# Patient Record
Sex: Female | Born: 1975 | Race: White | Hispanic: No | Marital: Married | Smoking: Never smoker
Health system: Southern US, Community
[De-identification: ages and names within clinical notes are randomized; demographics above are authoritative.]

## PROBLEM LIST (undated history)

## (undated) DIAGNOSIS — R011 Cardiac murmur, unspecified: Secondary | ICD-10-CM

## (undated) DIAGNOSIS — I10 Essential (primary) hypertension: Secondary | ICD-10-CM

## (undated) DIAGNOSIS — M1990 Unspecified osteoarthritis, unspecified site: Secondary | ICD-10-CM

## (undated) DIAGNOSIS — Z87442 Personal history of urinary calculi: Secondary | ICD-10-CM

## (undated) DIAGNOSIS — N289 Disorder of kidney and ureter, unspecified: Secondary | ICD-10-CM

## (undated) HISTORY — PX: CYST REMOVAL HAND: SHX6279

---
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
## 2001-01-04 ENCOUNTER — Inpatient Hospital Stay (HOSPITAL_COMMUNITY): Admission: AD | Admit: 2001-01-04 | Discharge: 2001-01-04 | Payer: Self-pay | Admitting: Obstetrics and Gynecology

## 2001-01-24 ENCOUNTER — Inpatient Hospital Stay (HOSPITAL_COMMUNITY): Admission: AD | Admit: 2001-01-24 | Discharge: 2001-01-27 | Payer: Self-pay | Admitting: Obstetrics and Gynecology

## 2001-01-28 ENCOUNTER — Encounter: Admission: RE | Admit: 2001-01-28 | Discharge: 2001-03-02 | Payer: Self-pay | Admitting: Obstetrics and Gynecology

## 2001-02-24 ENCOUNTER — Other Ambulatory Visit: Admission: RE | Admit: 2001-02-24 | Discharge: 2001-02-24 | Payer: Self-pay | Admitting: Obstetrics and Gynecology

## 2002-03-08 ENCOUNTER — Other Ambulatory Visit: Admission: RE | Admit: 2002-03-08 | Discharge: 2002-03-08 | Payer: Self-pay | Admitting: Obstetrics and Gynecology

## 2003-04-28 ENCOUNTER — Other Ambulatory Visit: Admission: RE | Admit: 2003-04-28 | Discharge: 2003-04-28 | Payer: Self-pay | Admitting: Obstetrics and Gynecology

## 2004-04-16 ENCOUNTER — Other Ambulatory Visit: Admission: RE | Admit: 2004-04-16 | Discharge: 2004-04-16 | Payer: Self-pay | Admitting: Obstetrics and Gynecology

## 2004-04-26 ENCOUNTER — Ambulatory Visit (HOSPITAL_BASED_OUTPATIENT_CLINIC_OR_DEPARTMENT_OTHER): Admission: RE | Admit: 2004-04-26 | Discharge: 2004-04-26 | Payer: Self-pay | Admitting: *Deleted

## 2004-12-03 ENCOUNTER — Other Ambulatory Visit: Admission: RE | Admit: 2004-12-03 | Discharge: 2004-12-03 | Payer: Self-pay | Admitting: Obstetrics and Gynecology

## 2004-12-29 HISTORY — PX: TUBAL LIGATION: SHX77

## 2005-03-15 ENCOUNTER — Inpatient Hospital Stay (HOSPITAL_COMMUNITY): Admission: AD | Admit: 2005-03-15 | Discharge: 2005-03-17 | Payer: Self-pay | Admitting: Obstetrics and Gynecology

## 2005-03-16 IMAGING — US US RETROPERITONEAL COMPLETE
1 series · 14 of 25 positions shown · non-contrast
Comparison: none

CLINICAL DATA: 24 weeks gestation.  Left flank pain.  
 BILATERAL RENAL ULTRASOUND:
 The right kidney measures 11.8 cm in length and the left kidney 12.8 cm in length.  There is mild fullness of the left renal pelvis and mild dilatation of the proximal left ureter.  However the ureter cannot be followed distally into the pelvis.  There is no dilatation of the right pelvocaliceal system or ureter.  There are ureteral jets noted within the bladder bilaterally and the bladder has a normal appearance.

[Series 1: us retroperitoneal complete · 0.33mm/px · 14 of 32 slices shown]
[im 1/32]
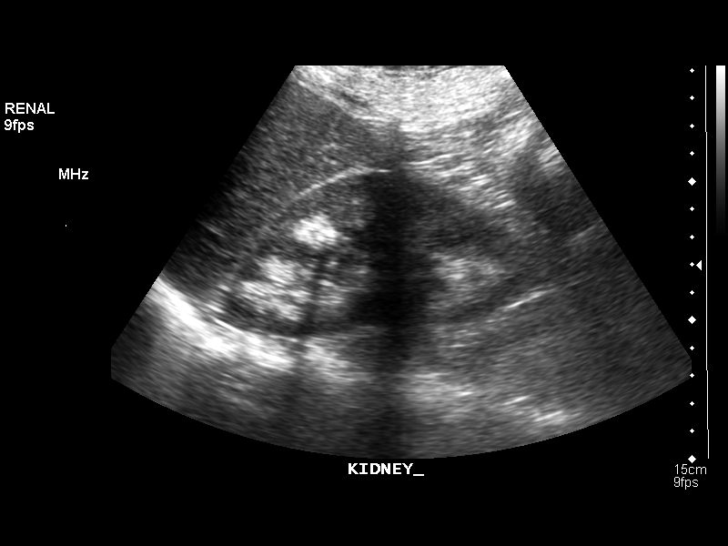
[im 3/32]
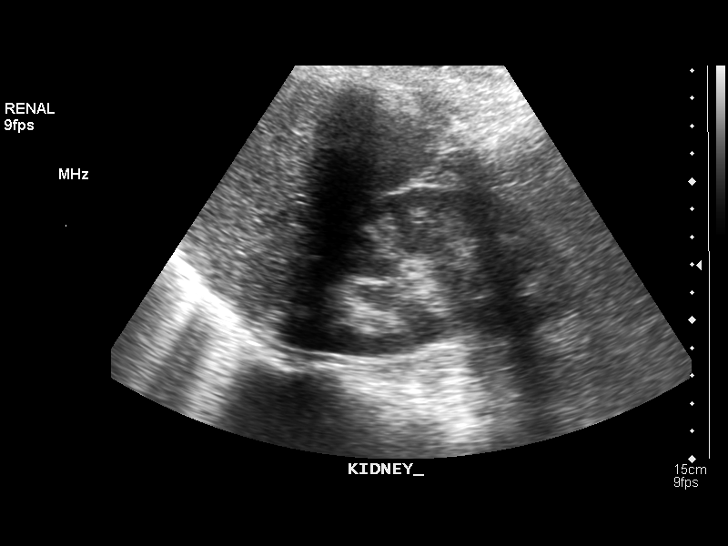
[im 6/32]
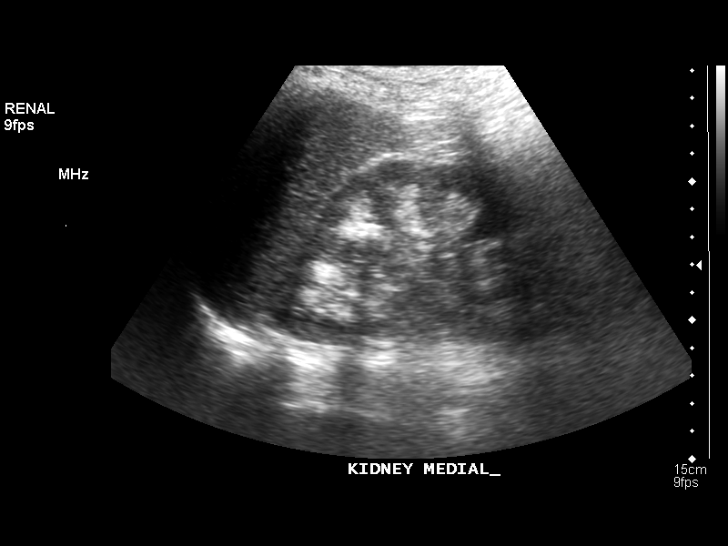
[im 8/32]
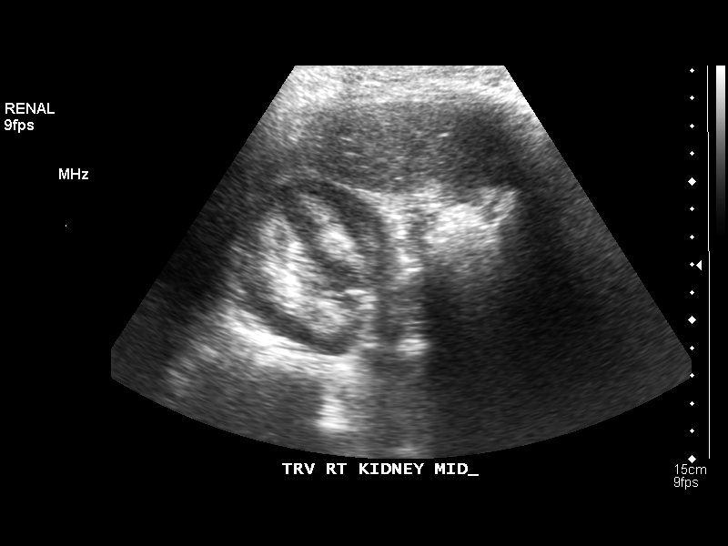
[im 11/32]
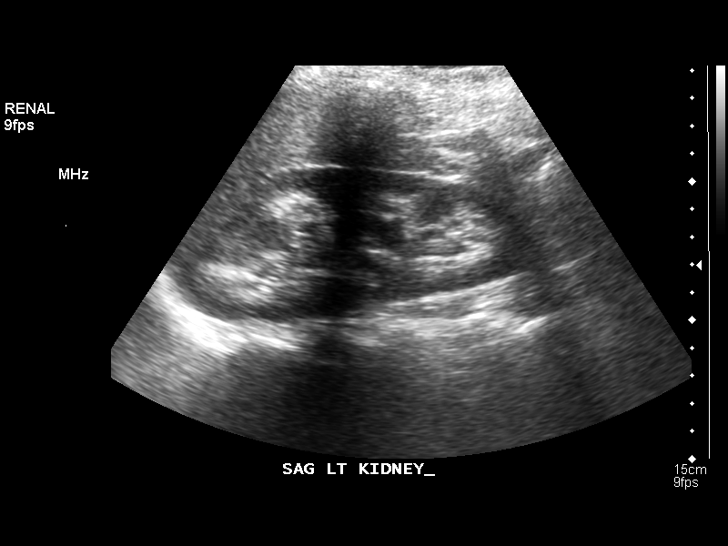
[im 12/32]
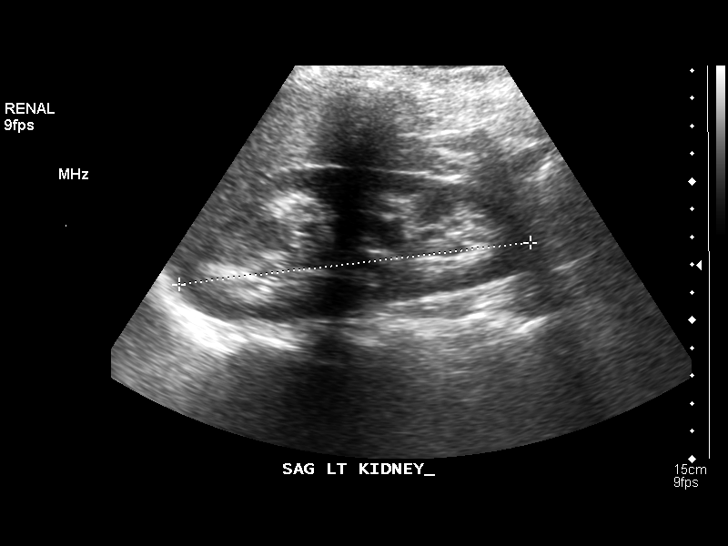
[im 15/32]
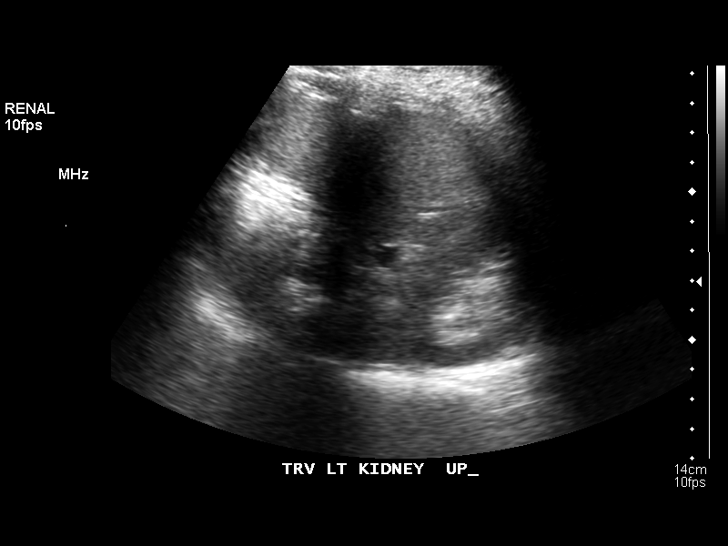
[im 17/32]
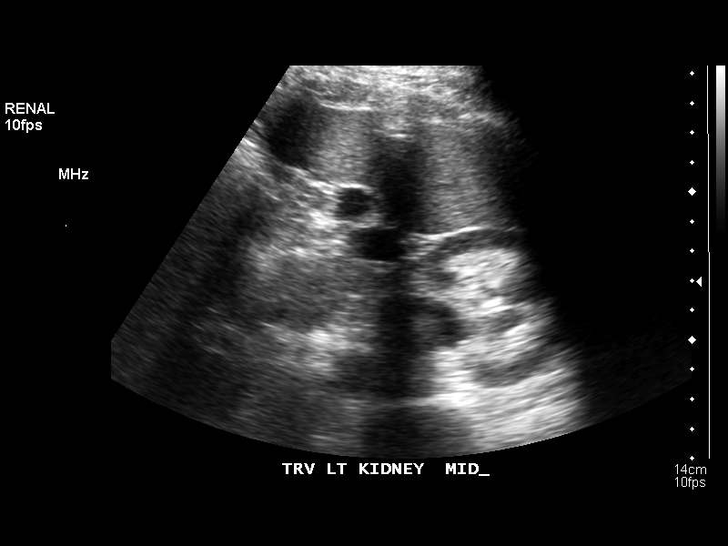
[im 20/32]
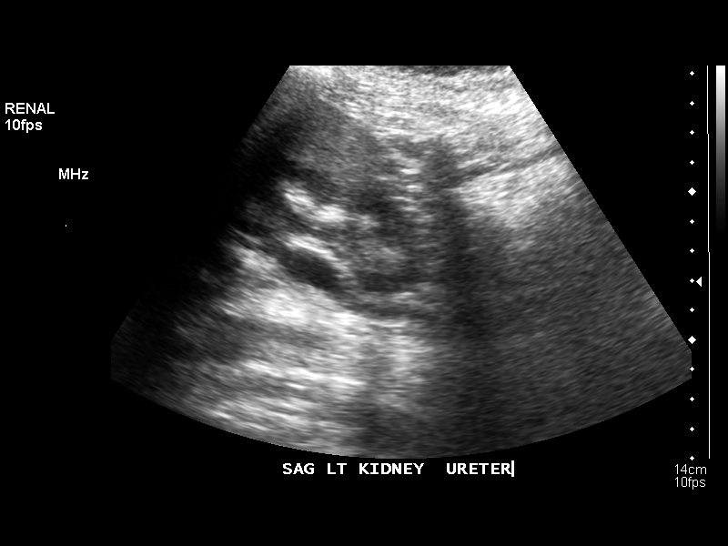
[im 21/32]
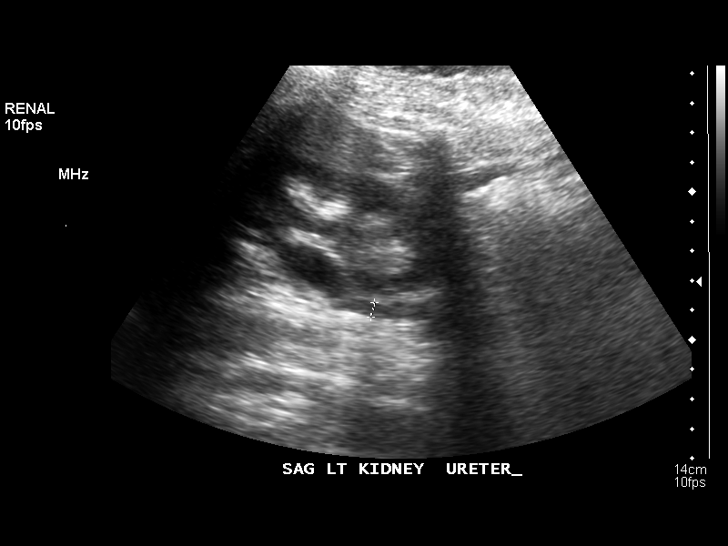
[im 24/32]
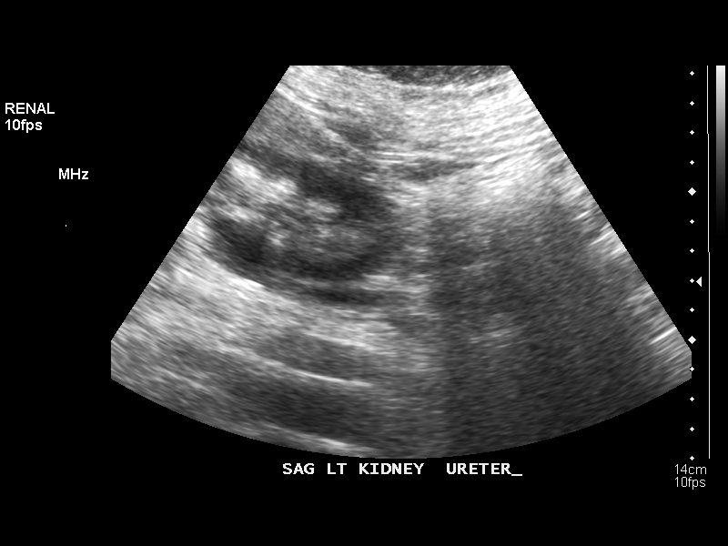
[im 26/32]
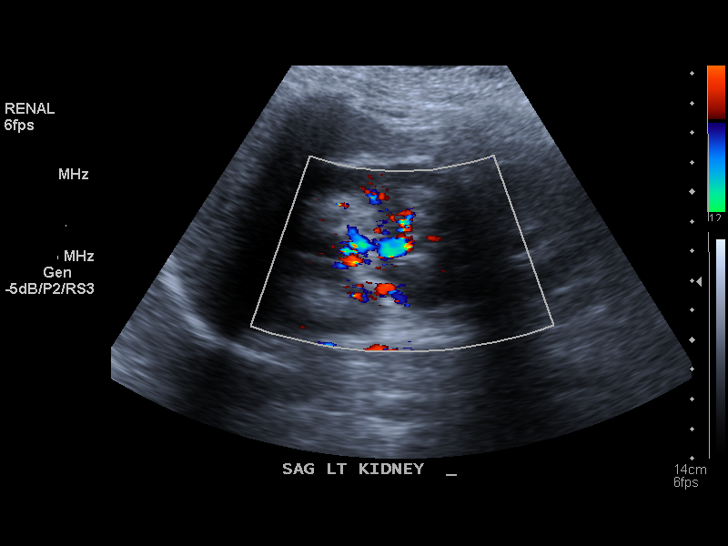
[im 29/32]
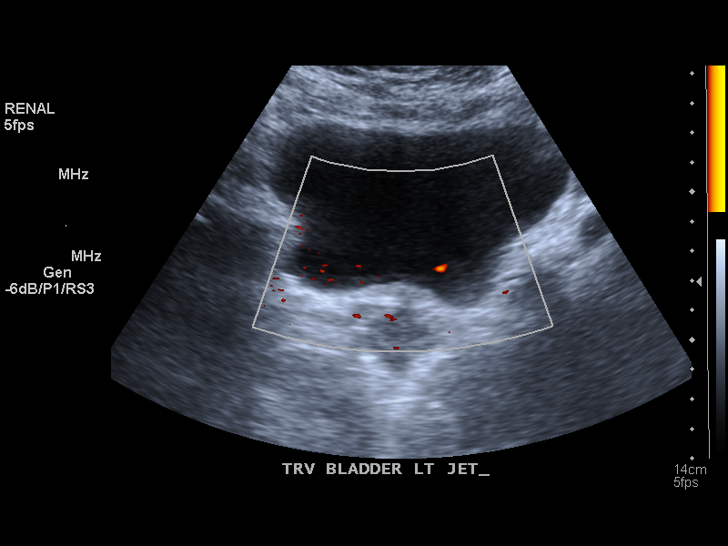
[im 32/32]
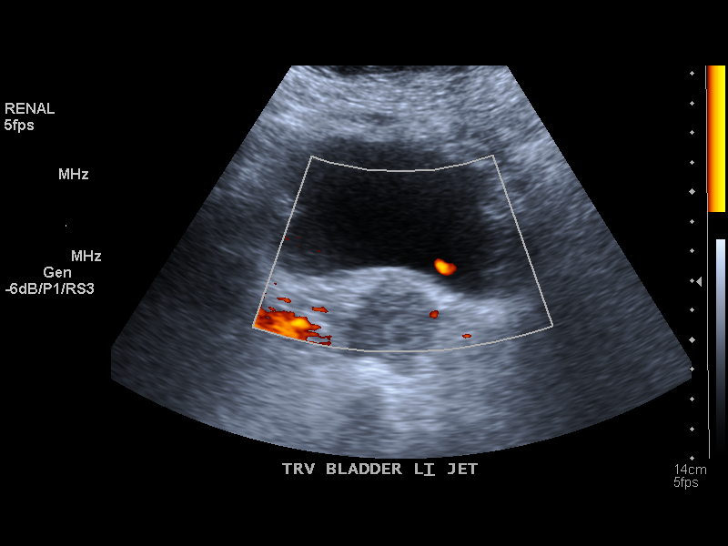

[14 of 25 positions shown; findings below may reference images not displayed]

IMPRESSION: Mild fullness/dilatation of the left renal pelvocaliceal system and proximal left ureter.  There are ureteral jets noted however, bilaterally.  Normal sized kidneys.

## 2005-06-09 ENCOUNTER — Inpatient Hospital Stay (HOSPITAL_COMMUNITY): Admission: AD | Admit: 2005-06-09 | Discharge: 2005-06-12 | Payer: Self-pay | Admitting: Obstetrics and Gynecology

## 2005-07-28 ENCOUNTER — Other Ambulatory Visit: Admission: RE | Admit: 2005-07-28 | Discharge: 2005-07-28 | Payer: Self-pay | Admitting: Obstetrics and Gynecology

## 2011-12-04 ENCOUNTER — Other Ambulatory Visit: Payer: Self-pay | Admitting: Urology

## 2011-12-05 ENCOUNTER — Other Ambulatory Visit: Payer: Self-pay | Admitting: Urology

## 2012-01-08 ENCOUNTER — Ambulatory Visit (HOSPITAL_COMMUNITY): Admission: RE | Admit: 2012-01-08 | Payer: BC Managed Care – PPO | Source: Ambulatory Visit | Admitting: Urology

## 2012-01-08 ENCOUNTER — Encounter (HOSPITAL_COMMUNITY): Admission: RE | Payer: Self-pay | Source: Ambulatory Visit

## 2012-01-08 SURGERY — LITHOTRIPSY, ESWL
Anesthesia: LOCAL | Laterality: Left

## 2015-04-03 ENCOUNTER — Other Ambulatory Visit: Payer: Self-pay | Admitting: Obstetrics and Gynecology

## 2015-04-04 LAB — CYTOLOGY - PAP

## 2017-02-25 ENCOUNTER — Emergency Department (HOSPITAL_COMMUNITY)
Admission: EM | Admit: 2017-02-25 | Discharge: 2017-02-25 | Disposition: A | Discharge status: Home / Self Care | Payer: BLUE CROSS/BLUE SHIELD | Attending: Emergency Medicine | Admitting: Emergency Medicine

## 2017-02-25 ENCOUNTER — Encounter (HOSPITAL_COMMUNITY): Payer: Self-pay

## 2017-02-25 DIAGNOSIS — N201 Calculus of ureter: Secondary | ICD-10-CM | POA: Insufficient documentation

## 2017-02-25 DIAGNOSIS — I10 Essential (primary) hypertension: Secondary | ICD-10-CM | POA: Insufficient documentation

## 2017-02-25 DIAGNOSIS — R109 Unspecified abdominal pain: Secondary | ICD-10-CM | POA: Diagnosis present

## 2017-02-25 DIAGNOSIS — N23 Unspecified renal colic: Secondary | ICD-10-CM

## 2017-02-25 HISTORY — DX: Disorder of kidney and ureter, unspecified: N28.9

## 2017-02-25 HISTORY — DX: Essential (primary) hypertension: I10

## 2017-02-25 LAB — COMPREHENSIVE METABOLIC PANEL
ALT: 20 U/L (ref 14–54)
AST: 20 U/L (ref 15–41)
Albumin: 4.2 g/dL (ref 3.5–5.0)
Alkaline Phosphatase: 58 U/L (ref 38–126)
Anion gap: 9 (ref 5–15)
BUN: 13 mg/dL (ref 6–20)
CO2: 23 mmol/L (ref 22–32)
Calcium: 9.1 mg/dL (ref 8.9–10.3)
Chloride: 102 mmol/L (ref 101–111)
Creatinine, Ser: 0.71 mg/dL (ref 0.44–1.00)
GFR calc Af Amer: >60 mL/min (ref >60)
GFR calc non Af Amer: >60 mL/min (ref >60)
Glucose, Bld: 104 mg/dL — ABNORMAL HIGH (ref 65–99)
Potassium: 3.7 mmol/L (ref 3.5–5.1)
Sodium: 134 mmol/L — ABNORMAL LOW (ref 135–145)
Total Bilirubin: 0.6 mg/dL (ref 0.3–1.2)
Total Protein: 7.2 g/dL (ref 6.5–8.1)

## 2017-02-25 LAB — URINALYSIS, ROUTINE W REFLEX MICROSCOPIC
Bacteria, UA: NONE SEEN
Bilirubin Urine: NEGATIVE
Glucose, UA: NEGATIVE mg/dL
Ketones, ur: NEGATIVE mg/dL
Leukocytes, UA: NEGATIVE
Nitrite: NEGATIVE
Protein, ur: NEGATIVE mg/dL
Specific Gravity, Urine: 1.003 — ABNORMAL LOW (ref 1.005–1.030)
pH: 7 (ref 5.0–8.0)

## 2017-02-25 LAB — CBC WITH DIFFERENTIAL/PLATELET
Basophils Absolute: 0 10*3/uL (ref 0.0–0.1)
Basophils Relative: 0 %
Eosinophils Absolute: 0.1 10*3/uL (ref 0.0–0.7)
Eosinophils Relative: 1 %
HCT: 39.5 % (ref 36.0–46.0)
Hemoglobin: 13.7 g/dL (ref 12.0–15.0)
Lymphocytes Relative: 21 %
Lymphs Abs: 1.9 10*3/uL (ref 0.7–4.0)
MCH: 30.4 pg (ref 26.0–34.0)
MCHC: 34.7 g/dL (ref 30.0–36.0)
MCV: 87.8 fL (ref 78.0–100.0)
Monocytes Absolute: 0.7 10*3/uL (ref 0.1–1.0)
Monocytes Relative: 7 %
Neutro Abs: 6.4 10*3/uL (ref 1.7–7.7)
Neutrophils Relative %: 71 %
Platelets: 232 10*3/uL (ref 150–400)
RBC: 4.5 MIL/uL (ref 3.87–5.11)
RDW: 12.2 % (ref 11.5–15.5)
WBC: 9 10*3/uL (ref 4.0–10.5)

## 2017-02-25 LAB — I-STAT BETA HCG BLOOD, ED (MC, WL, AP ONLY): I-stat hCG, quantitative: <5 m[IU]/mL (ref <5)

## 2017-02-25 LAB — LIPASE, BLOOD: Lipase: 34 U/L (ref 11–51)

## 2017-02-25 MED ORDER — SODIUM CHLORIDE 0.9 % IV SOLN
INTRAVENOUS | Status: DC
Start: 2017-02-25 — End: 2017-02-25
  Administered 2017-02-25: 09:00:00 via INTRAVENOUS

## 2017-02-25 MED ORDER — HYDROMORPHONE HCL 2 MG/ML IJ SOLN
0.5000 mg | INTRAMUSCULAR | Status: DC | PRN
  Administered 2017-02-25: 0.5 mg via INTRAVENOUS
  Filled 2017-02-25: qty 1

## 2017-02-25 MED ORDER — ONDANSETRON 8 MG PO TBDP: 8.0000 mg | ORAL_TABLET | Freq: Three times a day (TID) | ORAL | 0 refills | Status: DC | PRN

## 2017-02-25 MED ORDER — ONDANSETRON HCL 4 MG/2ML IJ SOLN
4.0000 mg | Freq: Once | INTRAMUSCULAR | Status: AC
Start: 2017-02-25 — End: 2017-02-25
  Administered 2017-02-25: 4 mg via INTRAVENOUS
  Filled 2017-02-25: qty 2

## 2017-02-25 MED ORDER — KETOROLAC TROMETHAMINE 30 MG/ML IJ SOLN
15.0000 mg | Freq: Once | INTRAMUSCULAR | Status: AC
  Administered 2017-02-25: 15 mg via INTRAVENOUS
  Filled 2017-02-25: qty 1

## 2017-02-25 MED ORDER — OXYCODONE-ACETAMINOPHEN 5-325 MG PO TABS: 1.0000 | ORAL_TABLET | Freq: Four times a day (QID) | ORAL | 0 refills | Status: DC | PRN

## 2017-02-25 MED ORDER — ONDANSETRON HCL 4 MG/2ML IJ SOLN
4.0000 mg | Freq: Once | INTRAMUSCULAR | Status: AC
  Administered 2017-02-25: 4 mg via INTRAVENOUS
  Filled 2017-02-25: qty 2

## 2017-02-25 NOTE — ED Triage Notes (Signed)
Patient c/o left flank pain. Patient re[ports a history of kidney stones. Patient states she is urinating small amounts.

## 2017-02-25 NOTE — Discharge Instructions (Signed)
Use a urine strainer, take the medications as needed for pain and nausea, follow-up with your urologist, return for worsening symptoms, fever, other concerns

## 2017-02-25 NOTE — ED Provider Notes (Signed)
[facility]-EMERGENCY DEPT Provider Note   CSN: [id number] Arrival date & time: 02/25/17  4098     History   Chief Complaint Chief Complaint  Patient presents with  . Flank Pain    HPI Kendra Haley is a 41 y.o. female.  Pt has a history of kidney stones.  Last episode was several years ago.  She woke up with left sided flank and abdominal pain suddenly this am that feels similar to prior kidney stones.  The pain is sharp and migrates toward the llq.   She has had nausea and vomiting with the symptoms.  No fever.  No diarrhea.  Some urinary urgency with small amounts of urine.  No gross hematuria.   The history is provided by the patient.  Flank Pain  This is a new problem. Episode onset: this morning. The problem occurs constantly. The problem has not changed since onset.Associated symptoms include abdominal pain. Pertinent negatives include no headaches and no shortness of breath. Nothing aggravates the symptoms. Nothing relieves the symptoms. She has tried nothing for the symptoms.    Past Medical History:  Diagnosis Date  . Hypertension   . Renal disorder     There are no active problems to display for this patient.   Past Surgical History:  Procedure Laterality Date  . CESAREAN SECTION      OB History    No data available       Home Medications    Prior to Admission medications   Medication Sig Start Date End Date Taking? Authorizing Provider  clarithromycin (BIAXIN) 500 MG tablet Take 500 mg by mouth 2 (two) times daily. Started 02/14 for 10 days   Yes Historical Provider, MD  HYDROcodone-homatropine (HYCODAN) 5-1.5 MG/5ML syrup Take 5 mLs by mouth every 6 (six) hours as needed for cough.   Yes Historical Provider, MD  ibuprofen (ADVIL,MOTRIN) 200 MG tablet Take 800 mg by mouth every 6 (six) hours as needed for fever, headache, mild pain, moderate pain or cramping.   Yes Historical Provider, MD  lisinopril (PRINIVIL,ZESTRIL) 10 MG tablet Take 10 mg by mouth daily  with breakfast.   Yes Historical Provider, MD  Multiple Vitamins-Minerals (ADULT GUMMY) CHEW Chew 2 each by mouth daily with breakfast.   Yes Historical Provider, MD  ondansetron (ZOFRAN) 8 MG tablet Take 8 mg by mouth every 8 (eight) hours as needed for nausea or vomiting.   Yes Historical Provider, MD  tamsulosin (FLOMAX) 0.4 MG CAPS capsule Take 0.4 mg by mouth once.   Yes Historical Provider, MD  ondansetron (ZOFRAN ODT) 8 MG disintegrating tablet Take 1 tablet (8 mg total) by mouth every 8 (eight) hours as needed for nausea or vomiting. 02/25/17   Linwood Dibbles, MD  oxyCODONE-acetaminophen (PERCOCET) 5-325 MG tablet Take 1 tablet by mouth every 6 (six) hours as needed. 02/25/17   Linwood Dibbles, MD    Family History No family history on file.  Social History Social History  Substance Use Topics  . Smoking status: Never Smoker  . Smokeless tobacco: Never Used  . Alcohol use No     Allergies   Macrobid [nitrofurantoin macrocrystal]; Penicillins; and Levaquin [levofloxacin]   Review of Systems Review of Systems  Respiratory: Negative for shortness of breath.   Gastrointestinal: Positive for abdominal pain.  Genitourinary: Positive for flank pain.  Neurological: Negative for headaches.  All other systems reviewed and are negative.    Physical Exam Updated Vital Signs BP 141/86 (BP Location: Right Arm)   Pulse  103   Temp 99.5 F (37.5 C) (Oral)   Resp 16   Ht 4\' 11"  (1.499 m)   Wt 83.9 kg   LMP 02/13/2017 (Approximate)   SpO2 98%   BMI 37.37 kg/m   Physical Exam  Constitutional: She appears well-developed and well-nourished. No distress.  HENT:  Head: Normocephalic and atraumatic.  Right Ear: External ear normal.  Left Ear: External ear normal.  Eyes: Conjunctivae are normal. Right eye exhibits no discharge. Left eye exhibits no discharge. No scleral icterus.  Neck: Neck supple. No tracheal deviation present.  Cardiovascular: Normal rate, regular rhythm and intact distal  pulses.   Pulmonary/Chest: Effort normal and breath sounds normal. No stridor. No respiratory distress. She has no wheezes. She has no rales.  Abdominal: Soft. Bowel sounds are normal. She exhibits no distension. There is tenderness in the left upper quadrant and left lower quadrant. There is no rigidity, no rebound, no guarding and no CVA tenderness. No hernia.  Musculoskeletal: She exhibits no edema or tenderness.  Neurological: She is alert. She has normal strength. No cranial nerve deficit (no facial droop, extraocular movements intact, no slurred speech) or sensory deficit. She exhibits normal muscle tone. She displays no seizure activity. Coordination normal.  Skin: Skin is warm and dry. No rash noted.  Psychiatric: She has a normal mood and affect.  Nursing note and vitals reviewed.    ED Treatments / Results  Labs (all labs ordered are listed, but only abnormal results are displayed) Labs Reviewed  COMPREHENSIVE METABOLIC PANEL - Abnormal; Notable for the following:       Result Value   Sodium 134 (*)    Glucose, Bld 104 (*)    All other components within normal limits  URINALYSIS, ROUTINE W REFLEX MICROSCOPIC - Abnormal; Notable for the following:    Color, Urine COLORLESS (*)    Specific Gravity, Urine 1.003 (*)    Hgb urine dipstick MODERATE (*)    Squamous Epithelial / LPF 0-5 (*)    All other components within normal limits  LIPASE, BLOOD  CBC WITH DIFFERENTIAL/PLATELET  I-STAT BETA HCG BLOOD, ED ([facility], [facility], [facility] ONLY)     Procedures Procedures (including critical care time)  Medications Ordered in ED Medications  HYDROmorphone (DILAUDID) injection 0.5 mg (0.5 mg Intravenous Given 02/25/17 0858)  0.9 %  sodium chloride infusion ( Intravenous Stopped 02/25/17 1115)  ondansetron (ZOFRAN) injection 4 mg (not administered)  ondansetron (ZOFRAN) injection 4 mg (4 mg Intravenous Given 02/25/17 0858)  ketorolac (TORADOL) 30 MG/ML injection 15 mg (15 mg Intravenous Given 02/25/17  0958)     Initial Impression / Assessment and Plan / ED Course  I have reviewed the triage vital signs and the nursing notes.  Pertinent labs & imaging results that were available during my care of the patient were reviewed by me and considered in my medical decision making (see chart for details).  Clinical Course as of Feb 25 1133  Wed Feb 25, 2017  1020 Still having pain.  Just received toradol.  Labs reviewed.  Suspect renal colic based on her sx, history, and lab findings.  [JK]  1131 Sx improved after additional meds.  Requested an additional nausea med  [JK]    Clinical Course User Index [JK] Linwood DibblesJon Knapp, MD   Patient's symptoms are consistent with ureteral colic.  SHe does have blood in her urine and sx are consistent with that.   Discussed holding off on CT scan at this point. Sx improved with  treatment in the ED.  Plan on dc home with urine strainer, pain meds.  Follow up with urology .  Return for worsening symptoms, fever  Final Clinical Impressions(s) / ED Diagnoses   Final diagnoses:  Ureteral colic    New Prescriptions New Prescriptions   ONDANSETRON (ZOFRAN ODT) 8 MG DISINTEGRATING TABLET    Take 1 tablet (8 mg total) by mouth every 8 (eight) hours as needed for nausea or vomiting.   OXYCODONE-ACETAMINOPHEN (PERCOCET) 5-325 MG TABLET    Take 1 tablet by mouth every 6 (six) hours as needed.     Linwood Dibbles, MD 02/25/17 [id number]

## 2017-02-25 NOTE — ED Notes (Signed)
Patient is A & O x4.  She understood discharge instructions. 

## 2017-04-24 ENCOUNTER — Other Ambulatory Visit: Payer: Self-pay | Admitting: Urology

## 2017-05-12 ENCOUNTER — Encounter (HOSPITAL_COMMUNITY): Payer: Self-pay | Admitting: *Deleted

## 2017-05-12 ENCOUNTER — Other Ambulatory Visit: Payer: Self-pay | Admitting: Urology

## 2017-05-14 ENCOUNTER — Encounter (HOSPITAL_COMMUNITY)
Admission: RE | Disposition: A | Discharge status: Home / Self Care | Payer: Self-pay | Source: Ambulatory Visit | Attending: Urology

## 2017-05-14 ENCOUNTER — Ambulatory Visit (HOSPITAL_COMMUNITY): Payer: BLUE CROSS/BLUE SHIELD

## 2017-05-14 ENCOUNTER — Ambulatory Visit (HOSPITAL_COMMUNITY)
Admission: RE | Admit: 2017-05-14 | Discharge: 2017-05-14 | Disposition: A | Discharge status: Home / Self Care | Payer: BLUE CROSS/BLUE SHIELD | Source: Ambulatory Visit | Attending: Urology | Admitting: Urology

## 2017-05-14 ENCOUNTER — Encounter (HOSPITAL_COMMUNITY): Payer: Self-pay | Admitting: *Deleted

## 2017-05-14 DIAGNOSIS — N201 Calculus of ureter: Secondary | ICD-10-CM | POA: Diagnosis not present

## 2017-05-14 DIAGNOSIS — Z880 Allergy status to penicillin: Secondary | ICD-10-CM | POA: Insufficient documentation

## 2017-05-14 DIAGNOSIS — Z79899 Other long term (current) drug therapy: Secondary | ICD-10-CM | POA: Diagnosis not present

## 2017-05-14 HISTORY — DX: Unspecified osteoarthritis, unspecified site: M19.90

## 2017-05-14 HISTORY — DX: Cardiac murmur, unspecified: R01.1

## 2017-05-14 HISTORY — DX: Personal history of urinary calculi: Z87.442

## 2017-05-14 HISTORY — PX: EXTRACORPOREAL SHOCK WAVE LITHOTRIPSY: SHX1557

## 2017-05-14 LAB — PREGNANCY, URINE: Preg Test, Ur: NEGATIVE

## 2017-05-14 IMAGING — CR DG ABDOMEN 1V
2 series · 2 of 2 positions shown · non-contrast
Comparison: [DATE].  CT of [DATE].

CLINICAL DATA: Pre lithotripsy.  Left ureteral stone.

EXAM:
ABDOMEN - 1 VIEW

[t abdomen supine (1 of 2)]
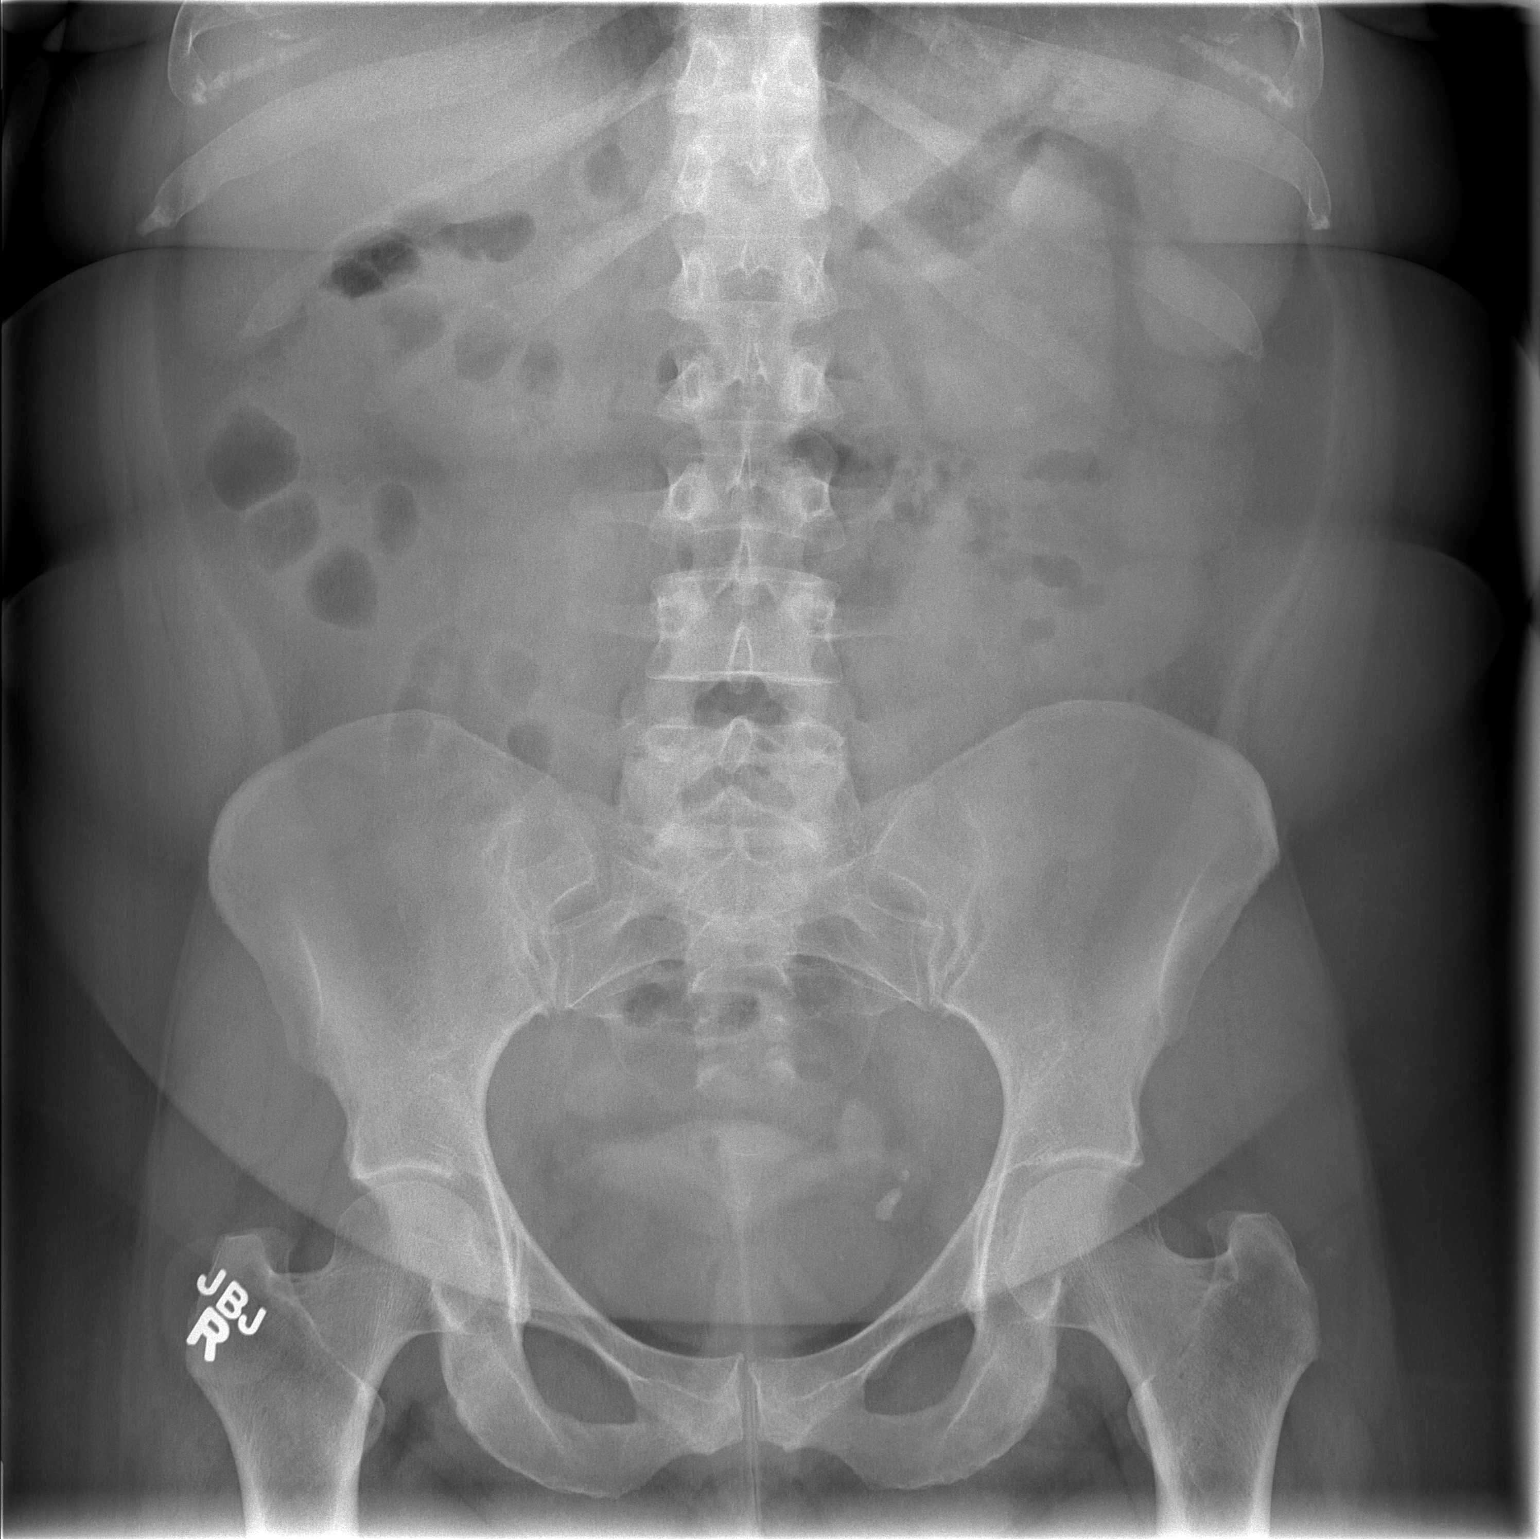

[t abdomen supine (2 of 2)]
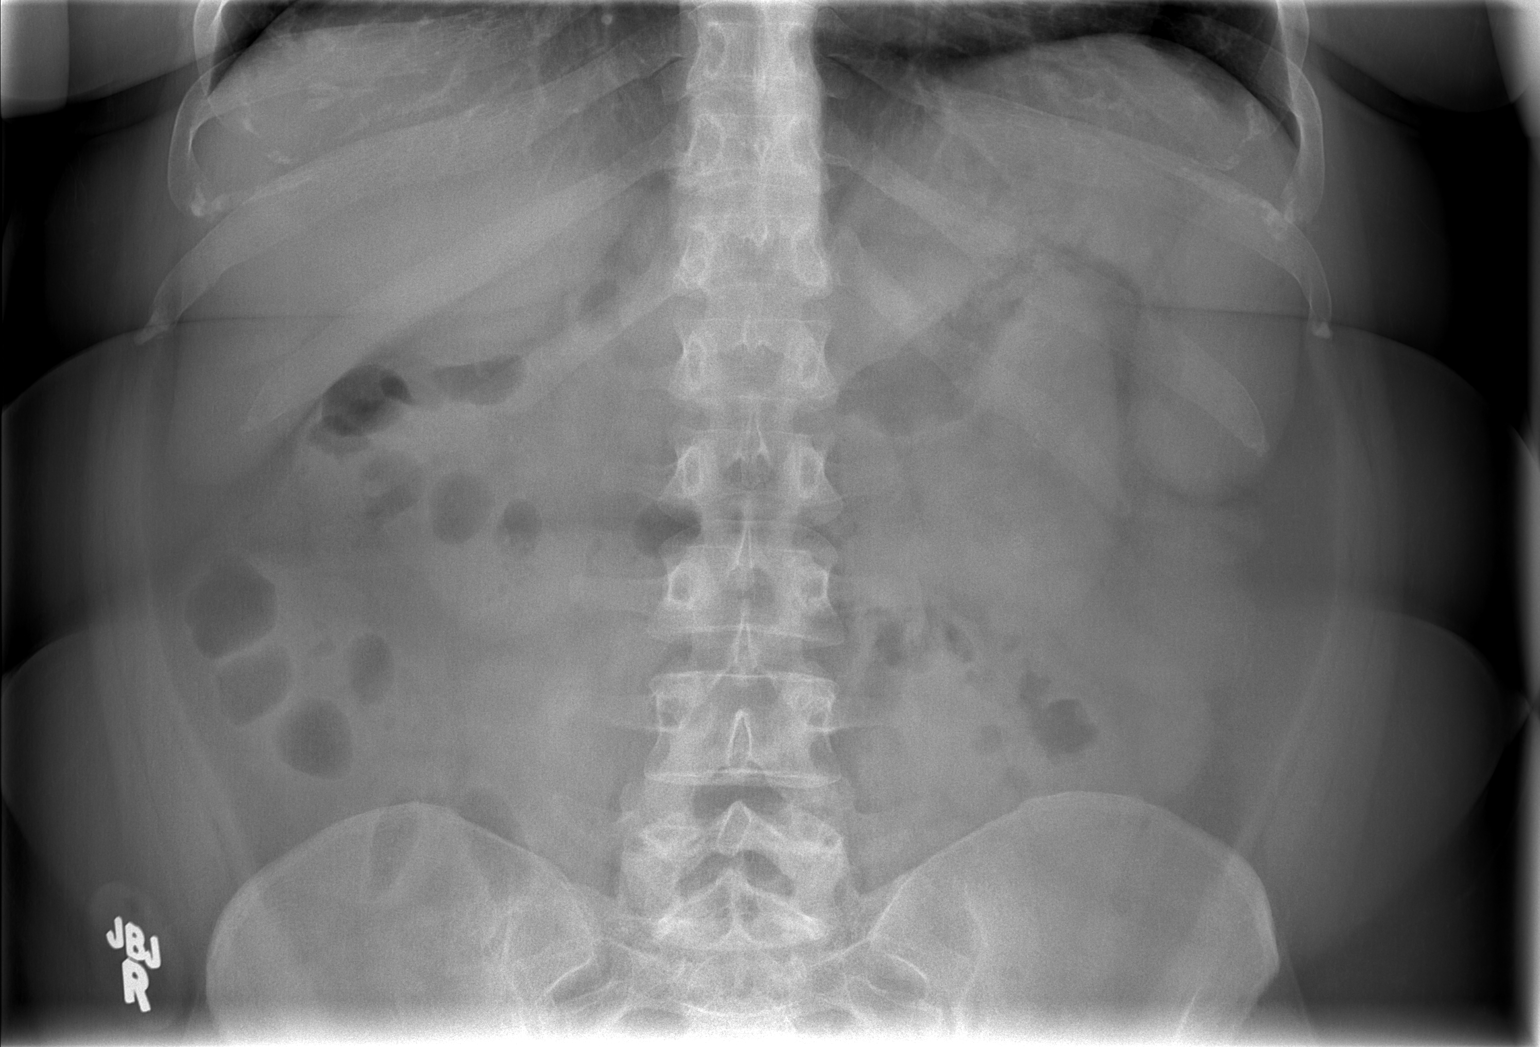

[2 of 2 positions shown; findings below may reference images not displayed]

FINDINGS: Non-obstructive bowel gas pattern. Vague calcifications over the
kidneys are likely due to renal calculi and medullary
nephrocalcinosis, when compared to the prior [54] CT. Left pelvic
calcifications of 8 and 4 mm are likely ureteric stones and are new
since the plain film of [54].
IMPRESSION: Left pelvic calcifications, likely representing distal ureteric
calculi.

Probable renal calculi and medullary nephrocalcinosis.

## 2017-05-14 SURGERY — LITHOTRIPSY, ESWL
Anesthesia: LOCAL | Laterality: Left

## 2017-05-14 MED ORDER — OXYCODONE-ACETAMINOPHEN 5-325 MG PO TABS
1.0000 | ORAL_TABLET | ORAL | Status: AC
  Administered 2017-05-14: 1 via ORAL
  Filled 2017-05-14: qty 1

## 2017-05-14 MED ORDER — CIPROFLOXACIN HCL 500 MG PO TABS: 500.0000 mg | ORAL_TABLET | ORAL | Status: DC

## 2017-05-14 MED ORDER — SODIUM CHLORIDE 0.9 % IV SOLN
INTRAVENOUS | Status: DC
  Administered 2017-05-14: 10:00:00 via INTRAVENOUS

## 2017-05-14 MED ORDER — CIPROFLOXACIN HCL 500 MG PO TABS
500.0000 mg | ORAL_TABLET | Freq: Once | ORAL | Status: AC
  Administered 2017-05-14: 500 mg via ORAL
  Filled 2017-05-14: qty 1

## 2017-05-14 MED ORDER — DIPHENHYDRAMINE HCL 25 MG PO CAPS
25.0000 mg | ORAL_CAPSULE | ORAL | Status: AC
  Administered 2017-05-14: 25 mg via ORAL
  Filled 2017-05-14: qty 1

## 2017-05-14 MED ORDER — DIAZEPAM 5 MG PO TABS
10.0000 mg | ORAL_TABLET | ORAL | Status: AC
  Administered 2017-05-14: 10 mg via ORAL
  Filled 2017-05-14: qty 2

## 2017-05-14 NOTE — Progress Notes (Signed)
Entered per wrong pt

## 2017-05-14 NOTE — Interval H&P Note (Signed)
History and Physical Interval Note:  05/14/2017 9:44 AM  Cox CommunicationsPenny L Haley  has presented today for surgery, with the diagnosis of LEFT URETEROVESICAL JUNCTION STONE  The various methods of treatment have been discussed with the patient and family. After consideration of risks, benefits and other options for treatment, the patient has consented to  Procedure(s): LEFT EXTRACORPOREAL SHOCK WAVE LITHOTRIPSY (ESWL) (Left) as a surgical intervention .  The patient's history has been reviewed, patient examined, no change in status, stable for surgery.  I have reviewed the patient's chart and labs.  Questions were answered to the patient's satisfaction.     BORDEN,LES

## 2017-05-14 NOTE — Discharge Instructions (Addendum)
Lithotripsy, Care After This sheet gives you information about how to care for yourself after your procedure. Your health care provider may also give you more specific instructions. If you have problems or questions, contact your health care provider. What can I expect after the procedure? After the procedure, it is common to have:  Some blood in your urine. This should only last for a few days.  Soreness in your back, sides, or upper abdomen for a few days.  Blotches or bruises on your back where the pressure wave entered the skin.  Pain, discomfort, or nausea when pieces (fragments) of the kidney stone move through the tube that carries urine from the kidney to the bladder (ureter). Stone fragments may pass soon after the procedure, but they may continue to pass for up to 4-8 weeks.  If you have severe pain or nausea, contact your health care provider. This may be caused by a large stone that was not broken up, and this may mean that you need more treatment.  Some pain or discomfort during urination.  Some pain or discomfort in the lower abdomen or (in men) at the base of the penis. Follow these instructions at home: Medicines   Take over-the-counter and prescription medicines only as told by your health care provider.  If you were prescribed an antibiotic medicine, take it as told by your health care provider. Do not stop taking the antibiotic even if you start to feel better.  Do not drive for 24 hours if you were given a medicine to help you relax (sedative).  Do not drive or use heavy machinery while taking prescription pain medicine. Eating and drinking   Drink enough water and fluids to keep your urine clear or pale yellow. This helps any remaining pieces of the stone to pass. It can also help prevent new stones from forming.  Eat plenty of fresh fruits and vegetables.  Follow instructions from your health care provider about eating and drinking restrictions. You may be  instructed:  To reduce how much salt (sodium) you eat or drink. Check ingredients and nutrition facts on packaged foods and beverages.  To reduce how much meat you eat.  Eat the recommended amount of calcium for your age and gender. Ask your health care provider how much calcium you should have. General instructions   Get plenty of rest.  Most people can resume normal activities 1-2 days after the procedure. Ask your health care provider what activities are safe for you.  If directed, strain all urine through the strainer that was provided by your health care provider.  Keep all fragments for your health care provider to see. Any stones that are found may be sent to a medical lab for examination. The stone may be as small as a grain of salt.  Keep all follow-up visits as told by your health care provider. This is important. Contact a health care provider if:  You have pain that is severe or does not get better with medicine.  You have nausea that is severe or does not go away.  You have blood in your urine longer than your health care provider told you to expect.  You have more blood in your urine.  You have pain during urination that does not go away.  You urinate more frequently than usual and this does not go away.  You develop a rash or any other possible signs of an allergic reaction. Get help right away if:  You have severe  pain in your back, sides, or upper abdomen.  You have severe pain while urinating.  Your urine is very dark red.  You have blood in your stool (feces).  You cannot pass any urine at all.  You feel a strong urge to urinate after emptying your bladder.  You have a fever or chills.  You develop shortness of breath, difficulty breathing, or chest pain.  You have severe nausea that leads to persistent vomiting.  You faint. Summary  After this procedure, it is common to have some pain, discomfort, or nausea when pieces (fragments) of the  kidney stone move through the tube that carries urine from the kidney to the bladder (ureter). If this pain or nausea is severe, however, you should contact your health care provider.  Most people can resume normal activities 1-2 days after the procedure. Ask your health care provider what activities are safe for you.  Drink enough water and fluids to keep your urine clear or pale yellow. This helps any remaining pieces of the stone to pass, and it can help prevent new stones from forming.  If directed, strain your urine and keep all fragments for your health care provider to see. Fragments or stones may be as small as a grain of salt.  Get help right away if you have severe pain in your back, sides, or upper abdomen or have severe pain while urinating. This information is not intended to replace advice given to you by your health care provider. Make sure you discuss any questions you have with your health care provider. Document Released: 01/04/2008 Document Revised: 11/05/2016 Document Reviewed: 11/05/2016 Elsevier Interactive Patient Education  2017 [place]. Moderate Conscious Sedation, Adult, Care After These instructions provide you with information about caring for yourself after your procedure. Your health care provider may also give you more specific instructions. Your treatment has been planned according to current medical practices, but problems sometimes occur. Call your health care provider if you have any problems or questions after your procedure. What can I expect after the procedure? After your procedure, it is common:  To feel sleepy for several hours.  To feel clumsy and have poor balance for several hours.  To have poor judgment for several hours.  To vomit if you eat too soon. Follow these instructions at home: For at least 24 hours after the procedure:    Do not:  Participate in activities where you could fall or become injured.  Drive.  Use heavy  machinery.  Drink alcohol.  Take sleeping pills or medicines that cause drowsiness.  Make important decisions or sign legal documents.  Take care of children on your own.  Rest. Eating and drinking   Follow the diet recommended by your health care provider.  If you vomit:  Drink water, juice, or soup when you can drink without vomiting.  Make sure you have little or no nausea before eating solid foods. General instructions   Have a responsible adult stay with you until you are awake and alert.  Take over-the-counter and prescription medicines only as told by your health care provider.  If you smoke, do not smoke without supervision.  Keep all follow-up visits as told by your health care provider. This is important. Contact a health care provider if:  You keep feeling nauseous or you keep vomiting.  You feel light-headed.  You develop a rash.  You have a fever. Get help right away if:  You have trouble breathing. This information is not  intended to replace advice given to you by your health care provider. Make sure you discuss any questions you have with your health care provider. Document Released: 10/05/2013 Document Revised: 05/19/2016 Document Reviewed: 04/05/2016 Elsevier Interactive Patient Education  2017 [employer]    1. You should strain your urine and collect all fragments and bring them to your follow up appointment.  2. You should take your pain medication as needed.  Please call if your pain is severe to the point that it is not controlled with your pain medication. 3. You should call if you develop fever > 101 or persistent nausea or vomiting. 4. Your doctor may prescribe tamsulosin to take to help facilitate stone passage.

## 2017-05-14 NOTE — H&P (Signed)
Office Visit Report     04/22/2017   --------------------------------------------------------------------------------   Kendra Haley  MRN: [id number]  PRIMARY CARE:  Guy SandiferJames E. Arleta Creekomblin II, MD  DOB: 1976/01/19, 41 year old Female  REFERRING:    SSN: [id number]  PROVIDER:  Ihor GullyMark Ottelin, M.D.    LOCATION:  [facility] [phone]   --------------------------------------------------------------------------------   CC: I have a ureteral stone.  HPI: Kendra Haley is a 41 year-old female established patient who is here for a ureteral calculus.  The patient's stone is one her left side. She first noticed the symptoms 02/25/2017. This is not her first kidney stone. There is a history of calculus disease in the family. She does have a burning sensation when she urinates. She has not caught a stone in her urine strainer since her symptoms began.   She has never had surgical treatment for calculi in the past.   03/20/17: She presented to the emergency room on 02/25/17 with acute onset left flank pain that was sharp in nature with radiation in the left lower quadrant. It was associated with nausea and vomiting as well as urgency and small frequent voiding. Imaging studies were discussed but were not performed.  She has been taking an old prescription of Flomax. She's not had a lot of severe pain but has had some nausea and moderate pain. The pain was initially in her flank but now is in the left lower quadrant and occasionally gives her a feeling of needing to urinate but with only a small amount voided. She has not had any fever, chills or other symptoms to suggest infection.   Interval history 04/10/17: At her last visit her KUB revealed an oblong-shaped stone just above the ureterovesical junction on the left-hand side measuring 10 mm long and 6 mm wide. She has passed multiple stones and wanted to proceed with medical expulsive therapy.   04/22/17: She has been doing well with no pain  but has been experiencing some frequency and urgency intermittently. She has not seen her stone passed and     ALLERGIES: levofloxacin Macrobid CAPS Penicillins    MEDICATIONS: Lisinopril 10 mg tablet  Oxycodone Hcl 10 mg tablet 1-2 tablet PO Q 4 H  Ibuprofen  Multi Vitamin  OxyCODONE HCl TABS Oral  Promethazine Hcl 25 mg tablet 1 tablet PO Q 4 H PRN  Zofran     GU PSH: None     PSH Notes: Hand Surgery, Cesarean Section, Tubal Ligation   NON-GU PSH: Cesarean Delivery Only - 2008 Tubal Ligation - 2008    GU PMH: Calculus Ureter, Calculus of ureter - 2014, Distal Ureteral Stone On The Right, - 2014, Ureteral Stone, - 2014 Kidney Stone, Bilateral kidney stones - 2014      PMH Notes: Nephrolithiasis:She has passed multiple stones previously. Previous CT scans have revealed fairly extensive calcification of the renal pyramids with bilateral stones.   Stone analysis: Calcium oxalate.  Repeat stone analysis 1/13: Calcium oxalate 65% and calcium phosphate 35%   Metabolic evaluation: She underwent a 24-hour urine and serum studies in 3/04 which revealed normal serum calcium, urinalysis and PTH. Her 24-hour urine showed a slightly elevated calcium oxalate level as well as a decreased total volume of 1.5 L/24 hours.     NON-GU PMH: Cardiac murmur, unspecified, Murmurs - 2014 Other disorders of calcium metabolism, Nephrocalcinosis - 2014 Personal history of other diseases of the circulatory system, History of hypertension - 2014 Arthritis Hypertension    FAMILY  HISTORY: 1 Daughter - Daughter 1 son - Son Breast Cancer - Grandmother, Grandmother Family Health Status Number - Runs In Family Hypertension - Grandmother nephrolithiasis - Father, Mother Urologic Disorder - Mother, Runs In Family, Father   SOCIAL HISTORY: Marital Status: Married Current Smoking Status: Patient has never smoked.   Tobacco Use Assessment Completed: Used Tobacco in last 30 days? Has never drank.   Drinks 2 caffeinated drinks per day. Patient's occupation Ecologist.     Notes: Never A Smoker, Tobacco Use, Occupation:, Caffeine Use, Marital History - Currently Married, Alcohol Use   REVIEW OF SYSTEMS:    GU Review Female:   Patient denies get up at night to urinate, have to strain to urinate, frequent urination, hard to postpone urination, leakage of urine, trouble starting your stream, burning /pain with urination, currently pregnant, and stream starts and stops.  Gastrointestinal (Upper):   Patient denies nausea, vomiting, and indigestion/ heartburn.  Gastrointestinal (Lower):   Patient denies diarrhea and constipation.  Constitutional:   Patient denies fever, night sweats, weight loss, and fatigue.  Skin:   Patient denies skin rash/ lesion and itching.  Eyes:   Patient denies blurred vision and double vision.  Ears/ Nose/ Throat:   Patient denies sore throat and sinus problems.  Hematologic/Lymphatic:   Patient denies swollen glands and easy bruising.  Cardiovascular:   Patient denies leg swelling and chest pains.  Respiratory:   Patient denies cough and shortness of breath.  Endocrine:   Patient denies excessive thirst.  Musculoskeletal:   Patient denies back pain and joint pain.  Neurological:   Patient denies headaches and dizziness.  Psychologic:   Patient denies depression and anxiety.   VITAL SIGNS:      04/22/2017 09:25 AM  Weight 185 lb / 83.91 kg  Height 61 in / 154.94 cm  BP 129/72 mmHg  Pulse 84 /min  BMI 35.0 kg/m   PAST DATA REVIEWED:  Source Of History:  Patient  Records Review:   Previous Patient Records, POC Tool  X-Ray Review: KUB: Reviewed Films. Previous KUB images reviewed and compared to today's study.    PROCEDURES:         KUB - [id number]  A single view of the abdomen is obtained.               Urinalysis Dipstick Dipstick Cont'd  Color: Yellow Bilirubin: Neg  Appearance: Clear Ketones: Neg  Specific Gravity: 1.020  Blood: Neg  pH: 7.5 Protein: Neg  Glucose: Neg Urobilinogen: 0.2    Nitrites: Neg    Leukocyte Esterase: Neg    ASSESSMENT:      ICD-10 Details  1 GU:   Calculus Ureter - N20.1 Left, Stable - Her stone has failed to progress. She continues to have intermittent irritative voiding symptoms but no significant pain. We discussed the treatment options again today but she said she has a fear of general anesthesia and therefore would like to proceed with lithotripsy even know her stone is in a good position for ureteroscopic management.     PLAN:           Schedule Return Visit/Planned Activity: Next Available Appointment - Schedule Surgery          Document Letter(s):  Created for Patient: Clinical Summary    * Signed by Ihor Gully, M.D. on 04/22/17 at 9:37 AM (EDT)*

## 2017-05-14 NOTE — Op Note (Signed)
See scanned chart for [facility] operative note. 

## 2017-05-15 ENCOUNTER — Encounter (HOSPITAL_COMMUNITY): Payer: Self-pay | Admitting: Urology

## 2019-08-03 ENCOUNTER — Other Ambulatory Visit: Payer: Self-pay | Admitting: *Deleted

## 2019-08-03 ENCOUNTER — Encounter: Payer: Self-pay | Admitting: *Deleted

## 2019-08-03 ENCOUNTER — Encounter: Payer: Self-pay | Admitting: Cardiology

## 2019-08-03 DIAGNOSIS — Z87442 Personal history of urinary calculi: Secondary | ICD-10-CM | POA: Insufficient documentation

## 2019-08-03 DIAGNOSIS — R011 Cardiac murmur, unspecified: Secondary | ICD-10-CM | POA: Insufficient documentation

## 2019-08-03 DIAGNOSIS — I10 Essential (primary) hypertension: Secondary | ICD-10-CM

## 2019-08-03 HISTORY — DX: Personal history of urinary calculi: Z87.442

## 2019-08-03 HISTORY — DX: Essential (primary) hypertension: I10

## 2019-08-05 ENCOUNTER — Ambulatory Visit: Payer: BLUE CROSS/BLUE SHIELD | Admitting: Cardiology

## 2019-09-09 ENCOUNTER — Other Ambulatory Visit: Payer: Self-pay

## 2019-09-09 ENCOUNTER — Ambulatory Visit: Payer: BLUE CROSS/BLUE SHIELD | Admitting: Cardiology

## 2019-09-09 ENCOUNTER — Encounter: Payer: Self-pay | Admitting: Cardiology

## 2019-09-09 VITALS — BP 135/93 | HR 81 | Temp 98.0°F | Ht 59.0 in | Wt 196.0 lb

## 2019-09-09 DIAGNOSIS — R0602 Shortness of breath: Secondary | ICD-10-CM | POA: Insufficient documentation

## 2019-09-09 DIAGNOSIS — I10 Essential (primary) hypertension: Secondary | ICD-10-CM | POA: Diagnosis not present

## 2019-09-09 NOTE — Progress Notes (Signed)
Patient referred by Everlene Farrier, MD for hypertension  Subjective:   Kendra Haley, female    DOB: 21-Oct-1976, 43 y.o.   MRN: [id number]   Chief Complaint  Patient presents with  . Shortness of Breath  . Hypertension  . New Patient (Initial Visit)    HPI  43 y.o. Caucasian female with hypertension.  Patient works at a [employer], corporate office.  None of them walking at work, she does not do any regular physical activity.  Recently, she was noted to have blood pressure elevation to 180/80 mmHg.  She endorses a lot of stress at work around the same time.  She is currently taking hydrochlorothiazide 12.5 mg daily.  She denies any angina or angina equivalent symptoms.  Patient endorses not pain attention to salt intake in her diet.  She snores occasionally.   Past Medical History:  Diagnosis Date  . Arthritis   . Heart murmur   . History of kidney stones   . History of urinary stone 08/03/2019  . Hypertension   . Hypertensive disorder 08/03/2019  . Renal disorder      Past Surgical History:  Procedure Laterality Date  . CESAREAN SECTION    . CYST REMOVAL HAND Right   . EXTRACORPOREAL SHOCK WAVE LITHOTRIPSY Left 05/14/2017   Procedure: LEFT EXTRACORPOREAL SHOCK WAVE LITHOTRIPSY (ESWL);  Surgeon: Raynelle Bring, MD;  Location: [facility];  Service: Urology;  Laterality: Left;  . TUBAL LIGATION  2006     Social History   Socioeconomic History  . Marital status: Married    Spouse name: Not on file  . Number of children: Not on file  . Years of education: Not on file  . Highest education level: Not on file  Occupational History  . Not on file  Social Needs  . Financial resource strain: Not on file  . Food insecurity    Worry: Not on file    Inability: Not on file  . Transportation needs    Medical: Not on file    Non-medical: Not on file  Tobacco Use  . Smoking status: Never Smoker  . Smokeless tobacco: Never Used  Substance and Sexual Activity  . Alcohol use: No   . Drug use: No  . Sexual activity: Not on file  Lifestyle  . Physical activity    Days per week: Not on file    Minutes per session: Not on file  . Stress: Not on file  Relationships  . Social Herbalist on phone: Not on file    Gets together: Not on file    Attends religious service: Not on file    Active member of club or organization: Not on file    Attends meetings of clubs or organizations: Not on file    Relationship status: Not on file  . Intimate partner violence    Fear of current or ex partner: Not on file    Emotionally abused: Not on file    Physically abused: Not on file    Forced sexual activity: Not on file  Other Topics Concern  . Not on file  Social History Narrative  . Not on file     Family History  Problem Relation Age of Onset  . Hypertension Mother   . Hyperlipidemia Mother   . Hyperlipidemia Father   . Hypertension Father   . Hypertension Brother      Current Outpatient Medications on File Prior to Visit  Medication Sig Dispense Refill  .  albuterol (VENTOLIN HFA) 108 (90 Base) MCG/ACT inhaler 2 puffs every 4 (four) hours.    . busPIRone (BUSPAR) 10 MG tablet Take 10 mg by mouth 3 (three) times daily as needed.    . ergocalciferol (VITAMIN D2) 1.25 MG (50000 UT) capsule Take 50,000 Units by mouth once a week.    . escitalopram (LEXAPRO) 20 MG tablet Take 20 mg by mouth daily.    . hydrochlorothiazide (MICROZIDE) 12.5 MG capsule Take 12.5 mg by mouth daily.    . Melatonin 5 MG TABS Take 1 tablet by mouth daily as needed.    . neomycin-polymyxin-hydrocortisone (CORTISPORIN) 3.5-10000-1 OTIC suspension Place 4 drops into both ears 3 (three) times daily.     No current facility-administered medications on file prior to visit.     Cardiovascular studies:  EKG 09/09/2019: Sinus rhythm 78 bpm. Cannot exclude old anteroseptal infarct. Poor R-wave progression.   Recent labs: 07/08/2019: Glucose 100. BUN/Cr 15/0.6. eGFR normal. Na/K  138/4.2. Rest of the CMP normal.  H/H 14/40. MCV 81. Platelets 265.  Chol 186, TG 64, HDL 63, LDL 117.  HbA1C 5.4%   Review of Systems  Constitution: Negative for decreased appetite, malaise/fatigue, weight gain and weight loss.  HENT: Negative for congestion.   Eyes: Negative for visual disturbance.  Cardiovascular: Negative for chest pain, dyspnea on exertion, leg swelling, palpitations and syncope.  Respiratory: Negative for cough.   Endocrine: Negative for cold intolerance.  Hematologic/Lymphatic: Does not bruise/bleed easily.  Skin: Negative for itching and rash.  Musculoskeletal: Negative for myalgias.  Gastrointestinal: Negative for abdominal pain, nausea and vomiting.  Genitourinary: Negative for dysuria.  Neurological: Negative for dizziness and weakness.  Psychiatric/Behavioral: The patient is not nervous/anxious.   All other systems reviewed and are negative.        Vitals:   09/09/19 1034  BP: (!) 135/93  Pulse: 81  Temp: 98 F (36.7 C)  SpO2: 96%     Body mass index is 39.59 kg/m. Filed Weights   09/09/19 1034  Weight: 196 lb (88.9 kg)     Objective:   Physical Exam  Constitutional: She is oriented to person, place, and time. She appears well-developed and well-nourished. No distress.  Moderately obese  HENT:  Head: Normocephalic and atraumatic.  Eyes: Pupils are equal, round, and reactive to light. Conjunctivae are normal.  Neck: No JVD present.  Cardiovascular: Normal rate, regular rhythm and intact distal pulses.  Murmur heard.  Systolic murmur is present with a grade of 2/6 at the upper right sternal border. Pulmonary/Chest: Effort normal and breath sounds normal. She has no wheezes. She has no rales.  Abdominal: Soft. Bowel sounds are normal. There is no rebound.  Musculoskeletal:        General: No edema.  Lymphadenopathy:    She has no cervical adenopathy.  Neurological: She is alert and oriented to person, place, and time. No cranial  nerve deficit.  Skin: Skin is warm and dry.  Psychiatric: She has a normal mood and affect.  Nursing note and vitals reviewed.         Assessment & Recommendations:   43 y.o. Caucasian female with hypertension.  Hypertension: Likely essential.  Low suspicion for secondary hypertension causes.  Continue hydrochlorothiazide, may increase to 25 mg daily.  A long discussion with the patient regarding diet and lifestyle modification, specifically regular physical activity and salt reduction.  I will obtain echocardiogram for baseline assessment.  Reviewed lipid panel with the patient.  Her 10-year ASCVD risk score  is 1%.  She does not need statin therapy, but would benefit from diet and lifestyle modification as above.  Continue regular follow-up with PCP.   Thank you for referring the patient to [place]. Please feel free to contact with any questions.  Nigel Mormon, MD [facility] Pager: [phone] Office: [phone] If no answer Cell [phone]

## 2019-09-23 ENCOUNTER — Other Ambulatory Visit: Payer: BC Managed Care – PPO

## 2019-10-05 ENCOUNTER — Other Ambulatory Visit: Payer: BC Managed Care – PPO

## 2019-10-13 ENCOUNTER — Other Ambulatory Visit: Payer: Self-pay

## 2019-10-13 ENCOUNTER — Ambulatory Visit (INDEPENDENT_AMBULATORY_CARE_PROVIDER_SITE_OTHER): Payer: BC Managed Care – PPO

## 2019-10-13 DIAGNOSIS — I10 Essential (primary) hypertension: Secondary | ICD-10-CM

## 2019-10-17 NOTE — Progress Notes (Signed)
Called pt to inform her about her echo. Pt understood

## 2019-11-29 ENCOUNTER — Other Ambulatory Visit: Payer: Self-pay | Admitting: Obstetrics and Gynecology

## 2019-11-29 DIAGNOSIS — R928 Other abnormal and inconclusive findings on diagnostic imaging of breast: Secondary | ICD-10-CM

## 2019-12-01 ENCOUNTER — Ambulatory Visit
Admission: RE | Admit: 2019-12-01 | Discharge: 2019-12-01 | Disposition: A | Discharge status: Home / Self Care | Payer: BLUE CROSS/BLUE SHIELD | Source: Ambulatory Visit | Attending: Obstetrics and Gynecology | Admitting: Obstetrics and Gynecology

## 2019-12-01 ENCOUNTER — Other Ambulatory Visit: Payer: Self-pay | Admitting: Obstetrics and Gynecology

## 2019-12-01 ENCOUNTER — Other Ambulatory Visit: Payer: Self-pay

## 2019-12-01 ENCOUNTER — Ambulatory Visit
Admission: RE | Admit: 2019-12-01 | Discharge: 2019-12-01 | Disposition: A | Discharge status: Home / Self Care | Payer: BC Managed Care – PPO | Source: Ambulatory Visit | Attending: Obstetrics and Gynecology | Admitting: Obstetrics and Gynecology

## 2019-12-01 DIAGNOSIS — R928 Other abnormal and inconclusive findings on diagnostic imaging of breast: Secondary | ICD-10-CM

## 2019-12-01 DIAGNOSIS — N6320 Unspecified lump in the left breast, unspecified quadrant: Secondary | ICD-10-CM

## 2019-12-01 IMAGING — US US BREAST*L* LIMITED INC AXILLA
1 series · 9 of 9 positions shown · non-contrast
Comparison: Previous exam(s).

CLINICAL DATA: 43-year-old female presenting for screening recall
of a possible right breast mass and a left breast asymmetry.

EXAM:
DIGITAL DIAGNOSTIC BILATERAL MAMMOGRAM WITH CAD AND TOMO
ULTRASOUND BILATERAL BREAST

[Series 1: us breast*left* limited inc axilla · 0.06mm/px · 9 of 9 slices shown]
[im 1/9]
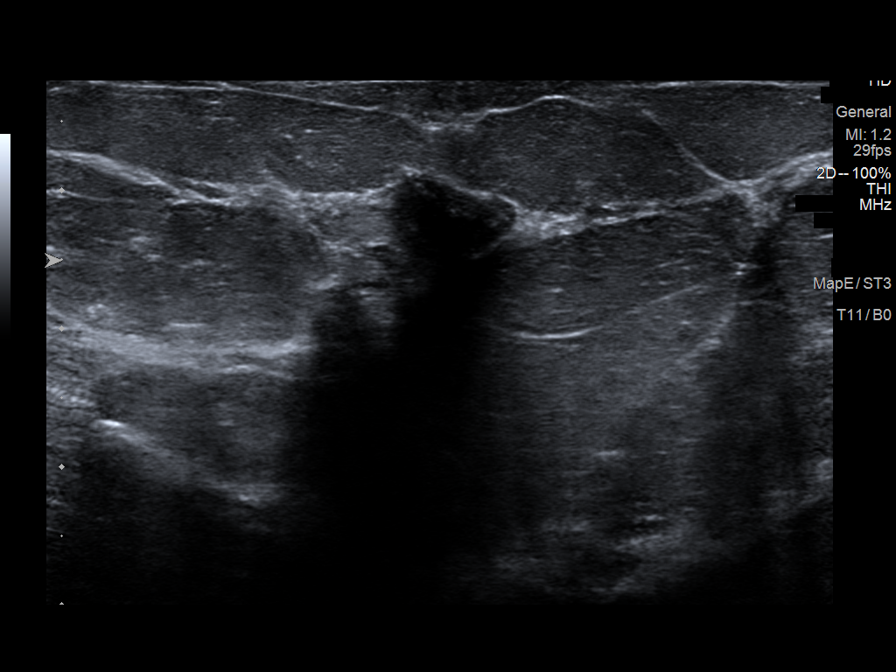
[im 2/9]
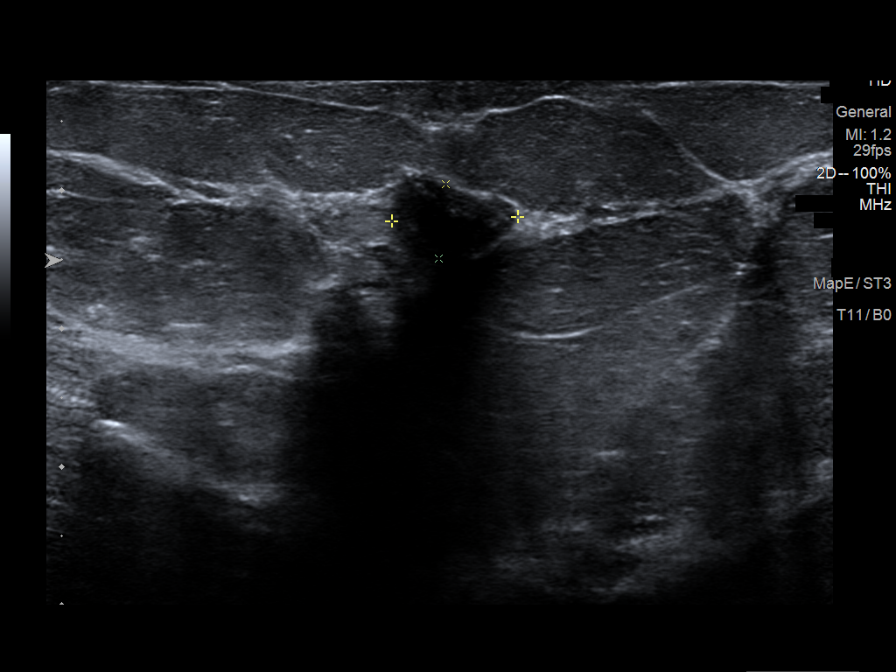
[im 3/9]
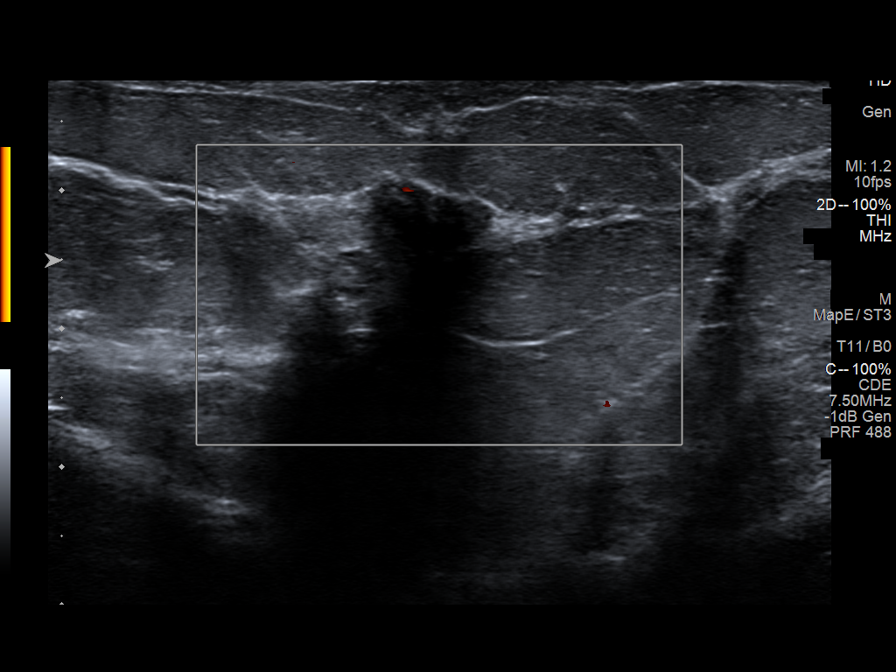
[im 4/9]
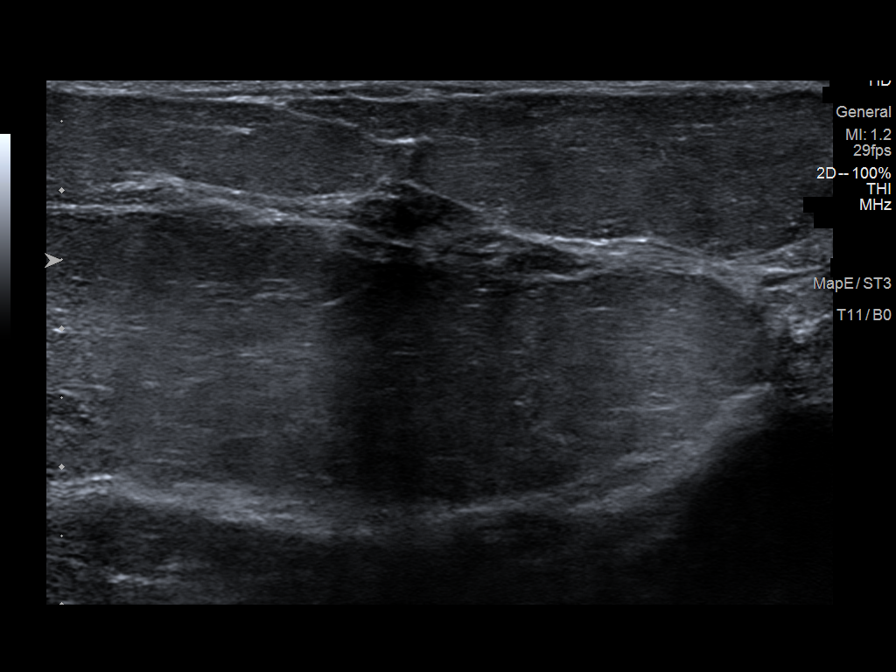
[im 5/9]
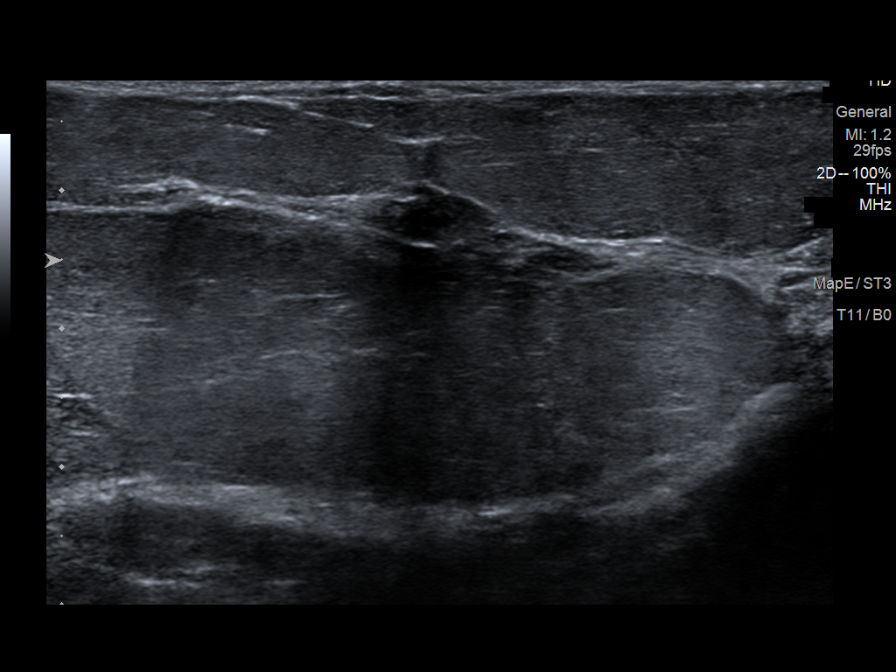
[im 6/9]
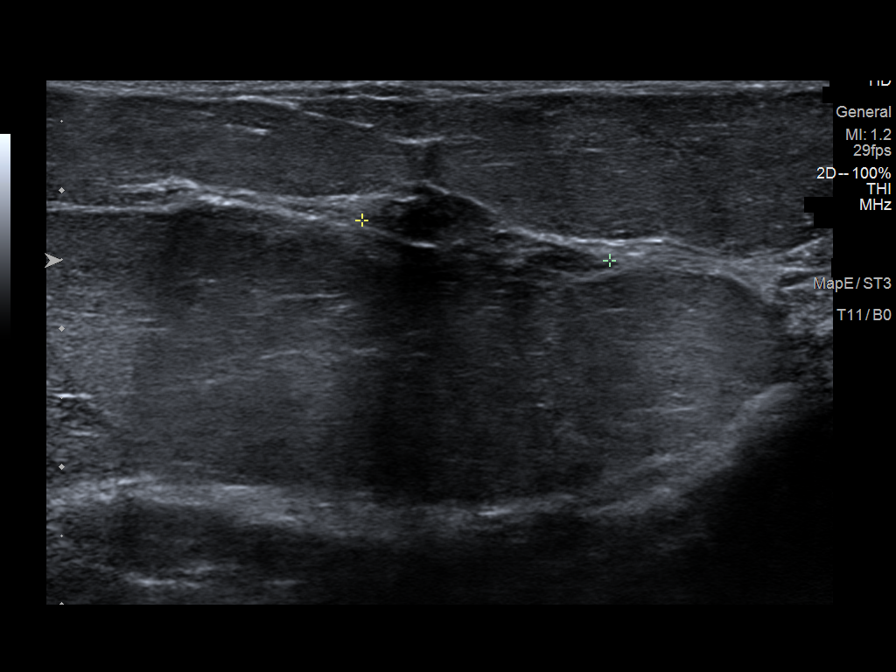
[im 7/9]
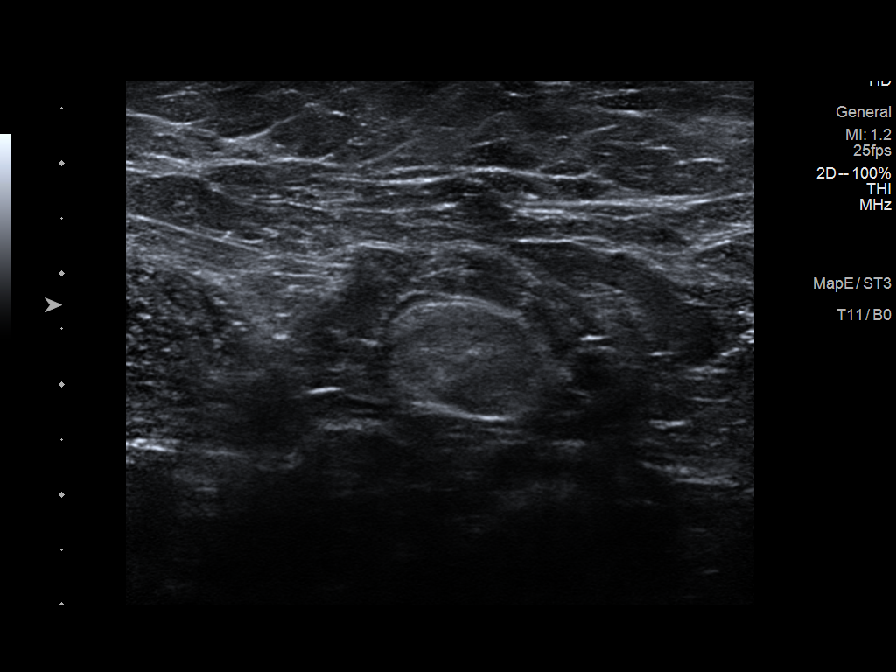
[im 8/9]
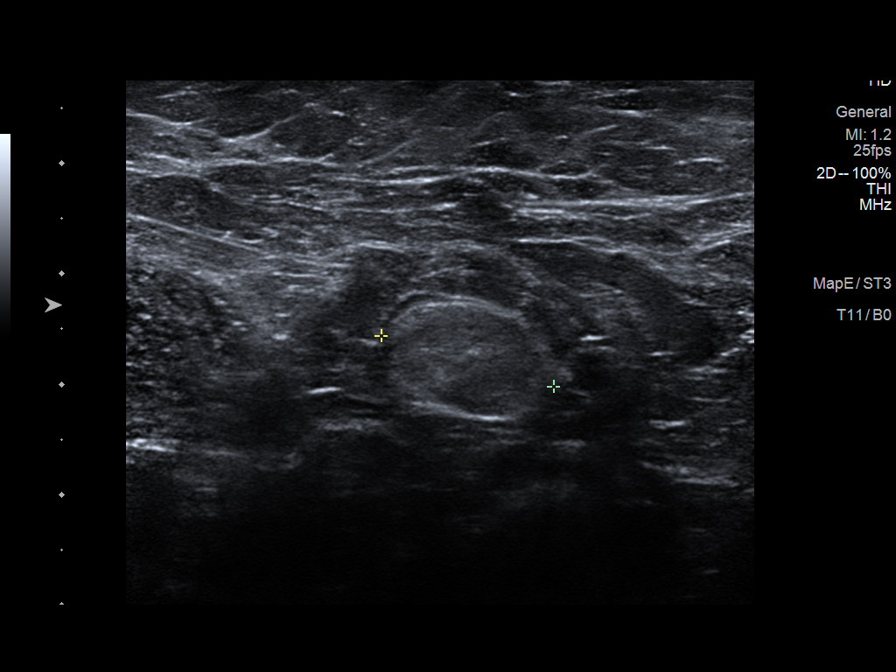
[im 9/9]
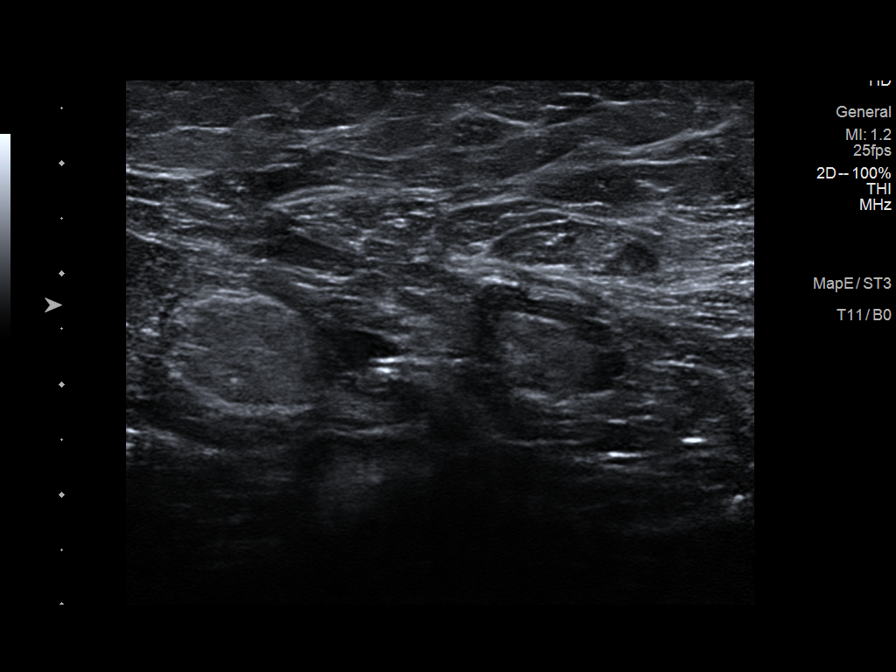

[9 of 9 positions shown; findings below may reference images not displayed]

ACR Breast Density Category c: The breast tissue is heterogeneously
dense, which may obscure small masses.
FINDINGS: Spot compression tomosynthesis images of the right breast
demonstrate a persistent oval obscured mass in the lateral posterior
depth. Spot compression tomosynthesis images of the asymmetry in the
medial anterior left breast demonstrates a persistent irregular
asymmetry/possible obscured mass. The correlate is not seen on the
true lateral view.

Mammographic images were processed with CAD.

Ultrasound of the right breast at 9 o'clock, 9 cm from the nipple
demonstrates an anechoic oval circumscribed mass measuring 1.3 x
x 1.1 cm.

Ultrasound of the left breast at 9 o'clock, 3 cm from the nipple
demonstrates a shadowing oval hypoechoic mass measuring 1.8 x 0.5 x
0.9 cm. A tiny amount of blood flow is seen on color Doppler
imaging. Ultrasound of the left axilla demonstrates multiple
normal-appearing lymph nodes.
IMPRESSION: 1.  There is an indeterminate left breast mass at 9 o'clock.

2.  No evidence of left axillary lymphadenopathy.

3. The mass in the right breast at 9 o'clock corresponds with a
benign cyst.

RECOMMENDATION:
Ultrasound guided biopsy is recommended for the left breast mass,
and has been scheduled for [DATE] at [DATE] p.m.

I have discussed the findings and recommendations with the patient.
If applicable, a reminder letter will be sent to the patient
regarding the next appointment.

BI-RADS CATEGORY  4: Suspicious.

## 2019-12-01 IMAGING — US US BREAST*R* LIMITED INC AXILLA
1 series · 5 of 5 positions shown · non-contrast
Comparison: Previous exam(s).

CLINICAL DATA: 43-year-old female presenting for screening recall
of a possible right breast mass and a left breast asymmetry.

EXAM:
DIGITAL DIAGNOSTIC BILATERAL MAMMOGRAM WITH CAD AND TOMO
ULTRASOUND BILATERAL BREAST

[Series 1: us breast*right* limited inc axilla · 0.08mm/px · 5 of 5 slices shown]
[im 1/5]
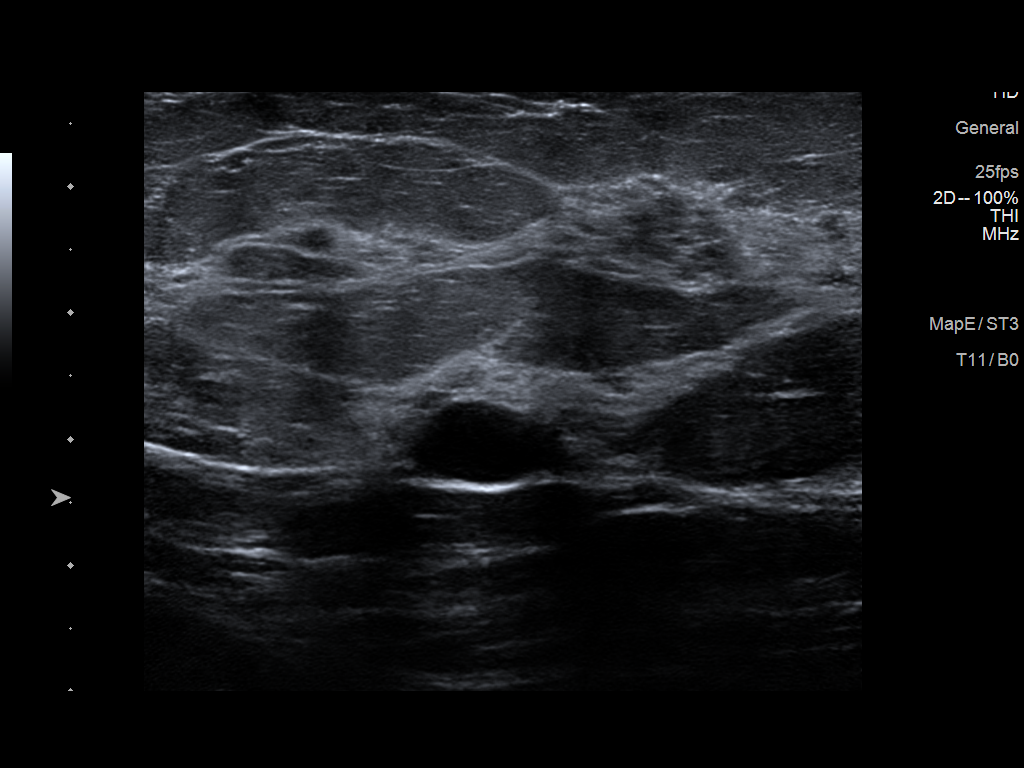
[im 2/5]
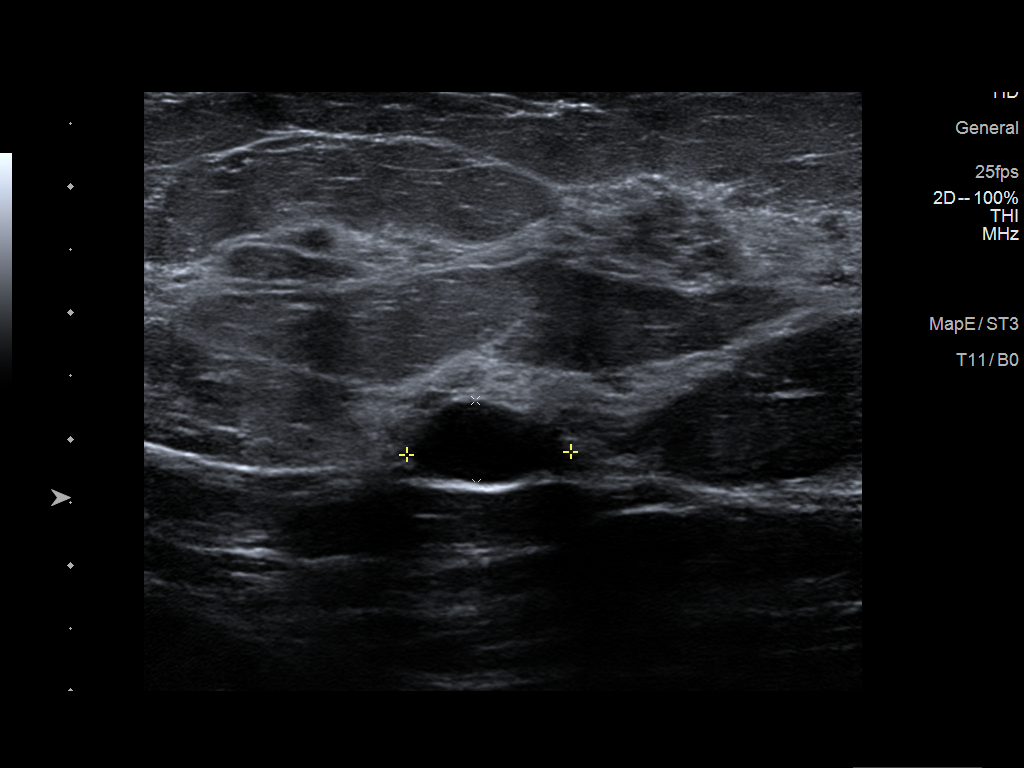
[im 3/5]
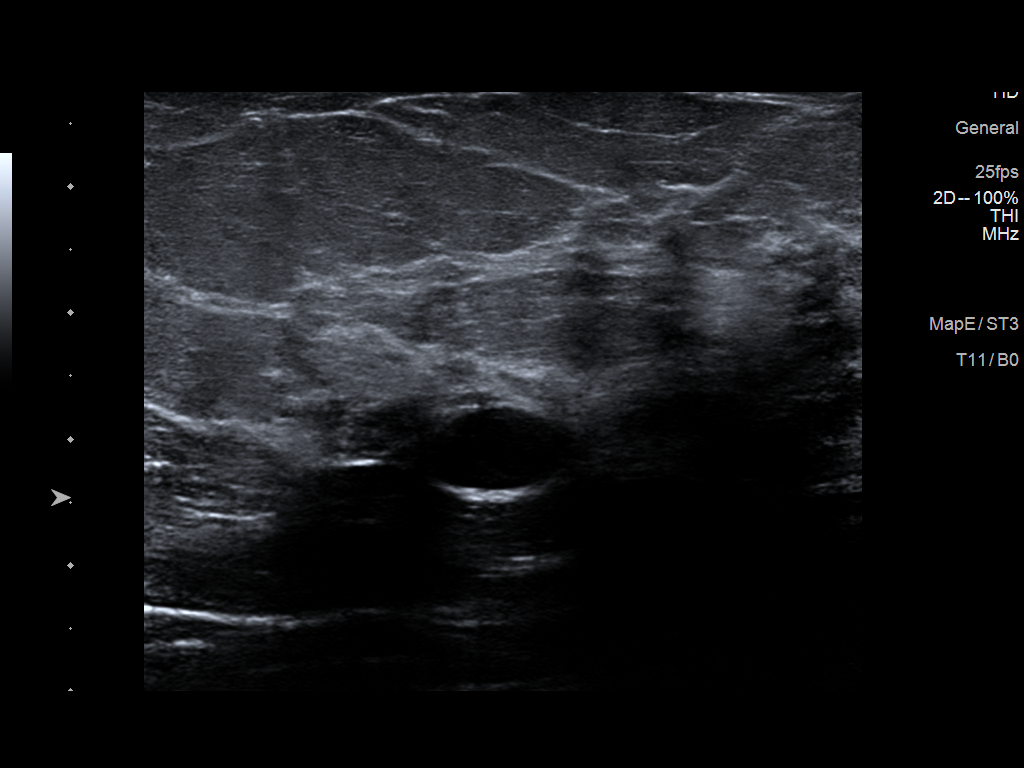
[im 4/5]
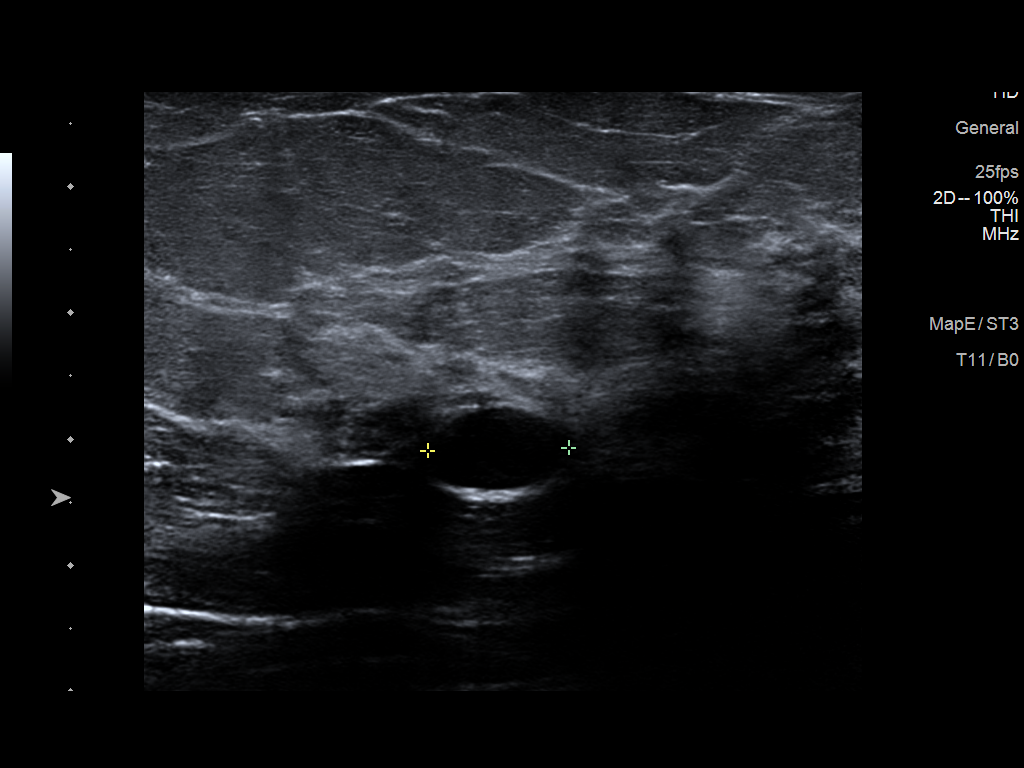
[im 5/5]
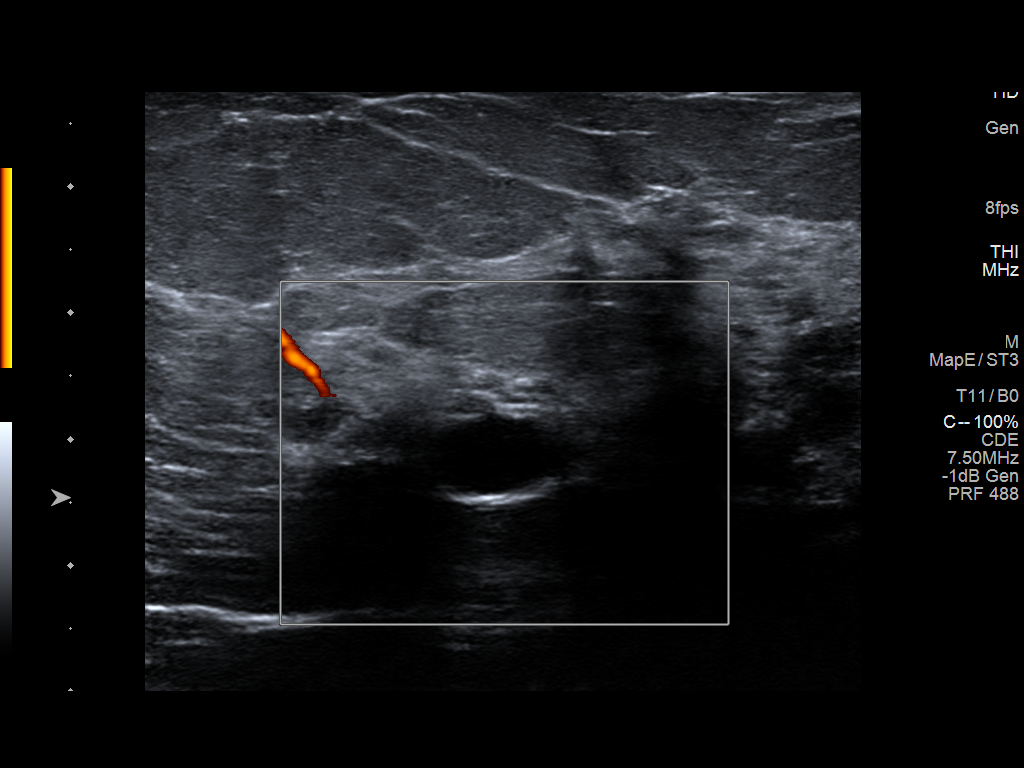

[5 of 5 positions shown; findings below may reference images not displayed]

ACR Breast Density Category c: The breast tissue is heterogeneously
dense, which may obscure small masses.
FINDINGS: Spot compression tomosynthesis images of the right breast
demonstrate a persistent oval obscured mass in the lateral posterior
depth. Spot compression tomosynthesis images of the asymmetry in the
medial anterior left breast demonstrates a persistent irregular
asymmetry/possible obscured mass. The correlate is not seen on the
true lateral view.

Mammographic images were processed with CAD.

Ultrasound of the right breast at 9 o'clock, 9 cm from the nipple
demonstrates an anechoic oval circumscribed mass measuring 1.3 x
x 1.1 cm.

Ultrasound of the left breast at 9 o'clock, 3 cm from the nipple
demonstrates a shadowing oval hypoechoic mass measuring 1.8 x 0.5 x
0.9 cm. A tiny amount of blood flow is seen on color Doppler
imaging. Ultrasound of the left axilla demonstrates multiple
normal-appearing lymph nodes.
IMPRESSION: 1.  There is an indeterminate left breast mass at 9 o'clock.

2.  No evidence of left axillary lymphadenopathy.

3. The mass in the right breast at 9 o'clock corresponds with a
benign cyst.

RECOMMENDATION:
Ultrasound guided biopsy is recommended for the left breast mass,
and has been scheduled for [DATE] at [DATE] p.m.

I have discussed the findings and recommendations with the patient.
If applicable, a reminder letter will be sent to the patient
regarding the next appointment.

BI-RADS CATEGORY  4: Suspicious.

## 2019-12-01 IMAGING — MG DIGITAL DIAGNOSTIC BILAT W/ TOMO W/ CAD
8 series · 8 of 24 positions shown · non-contrast
Comparison: Previous exam(s).

CLINICAL DATA: 43-year-old female presenting for screening recall
of a possible right breast mass and a left breast asymmetry.

EXAM:
DIGITAL DIAGNOSTIC BILATERAL MAMMOGRAM WITH CAD AND TOMO
ULTRASOUND BILATERAL BREAST

[R MLO synth-2D]
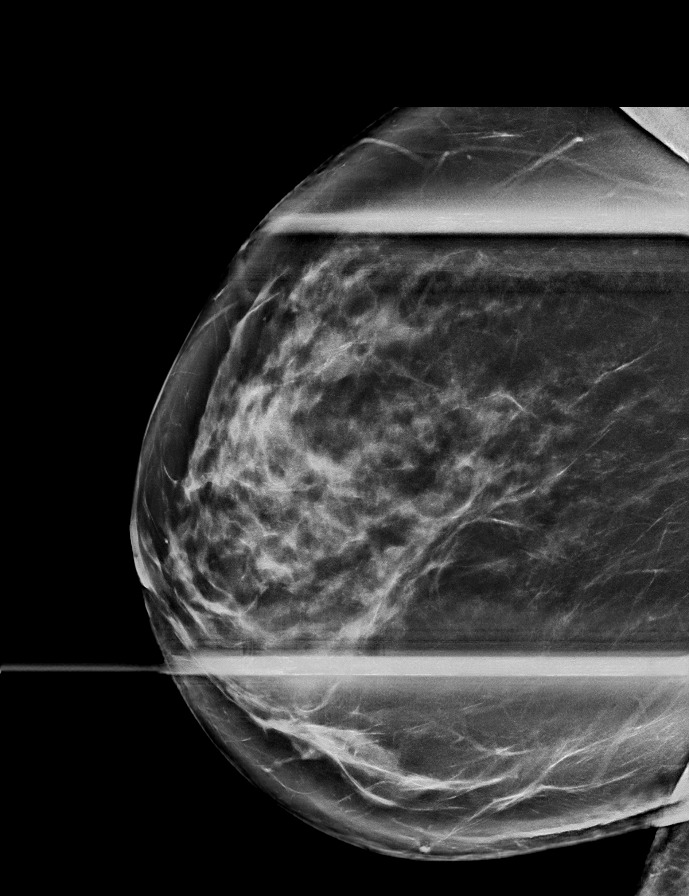

[L CC synth-2D]
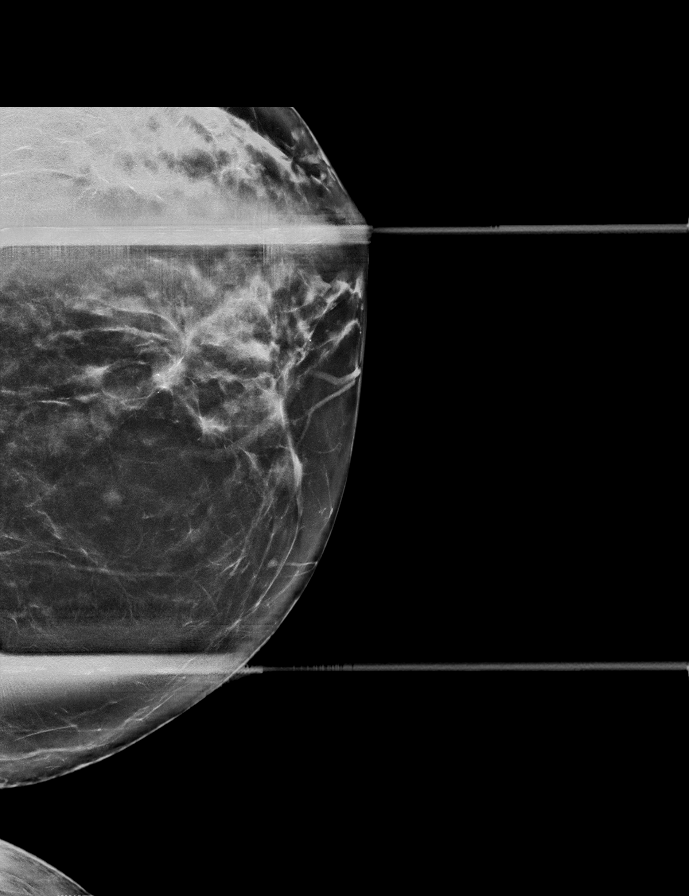

[L ML synth-2D]
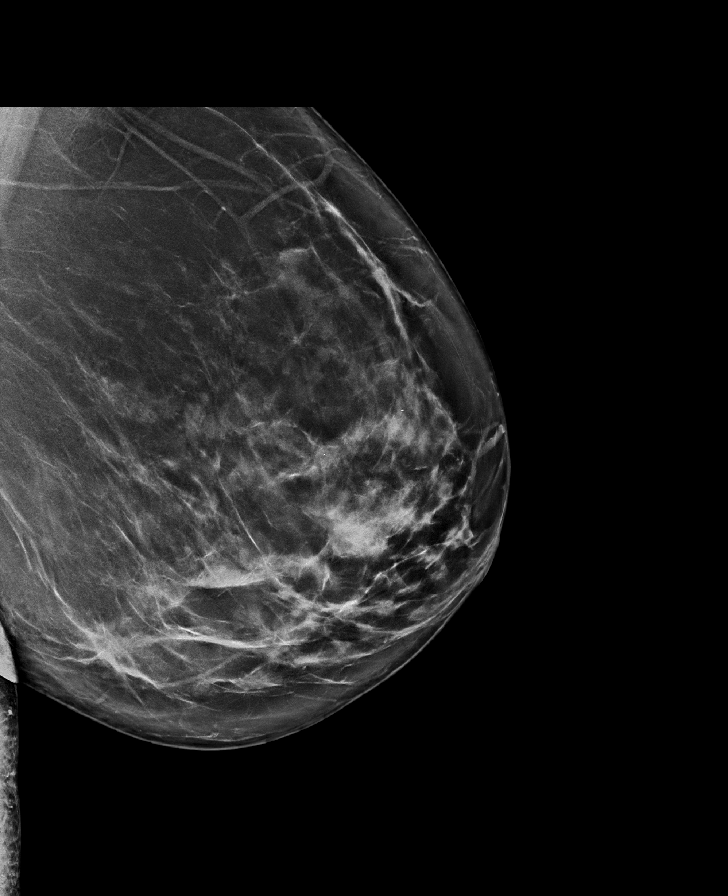

[R CC synth-2D]
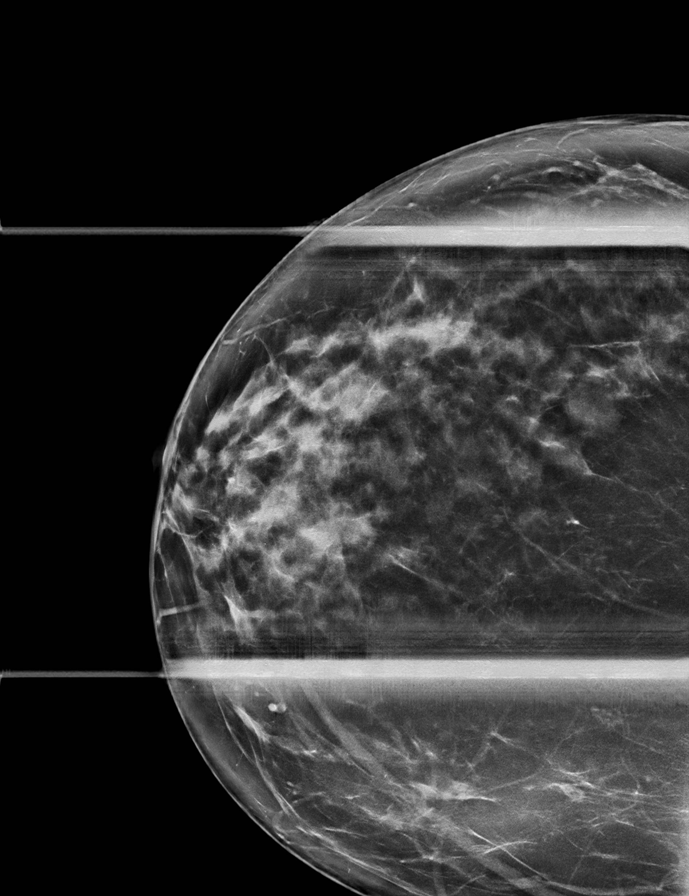

[L ML tomo · tomo slice 47/92.0]
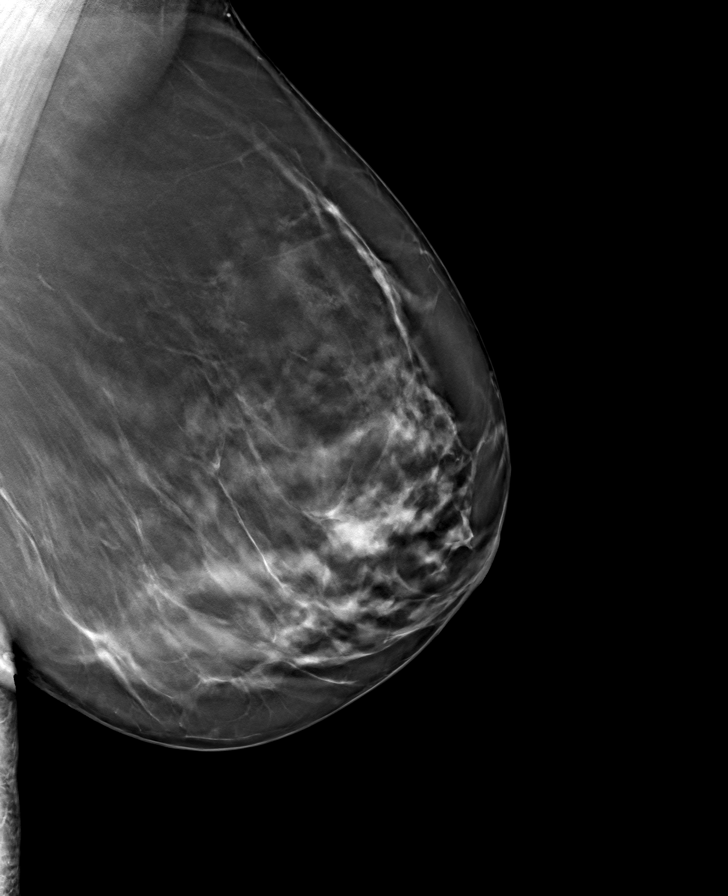

[R CC tomo · tomo slice 40/79.0]
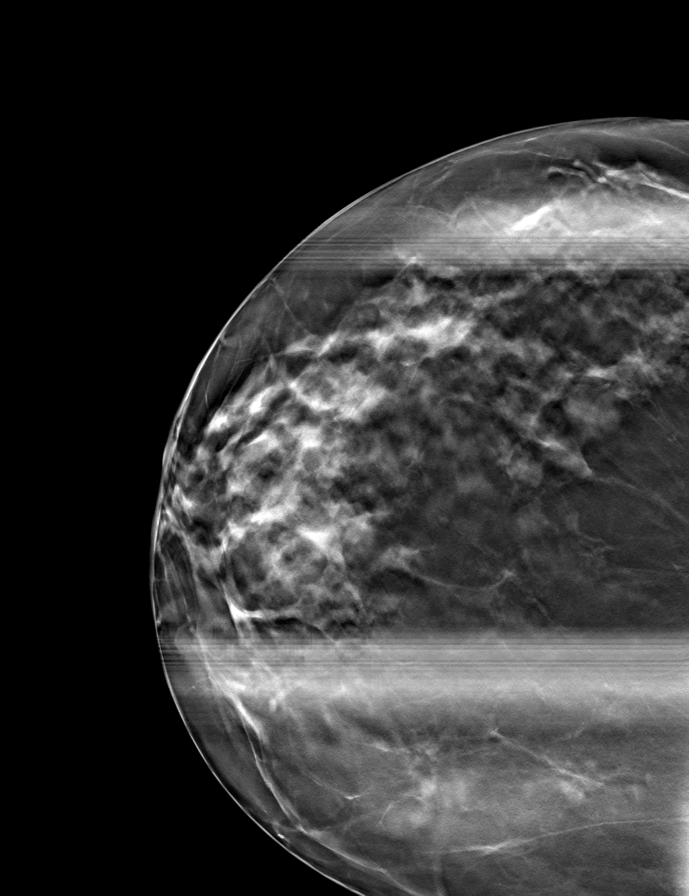

[L CC tomo · tomo slice 37/73.0]
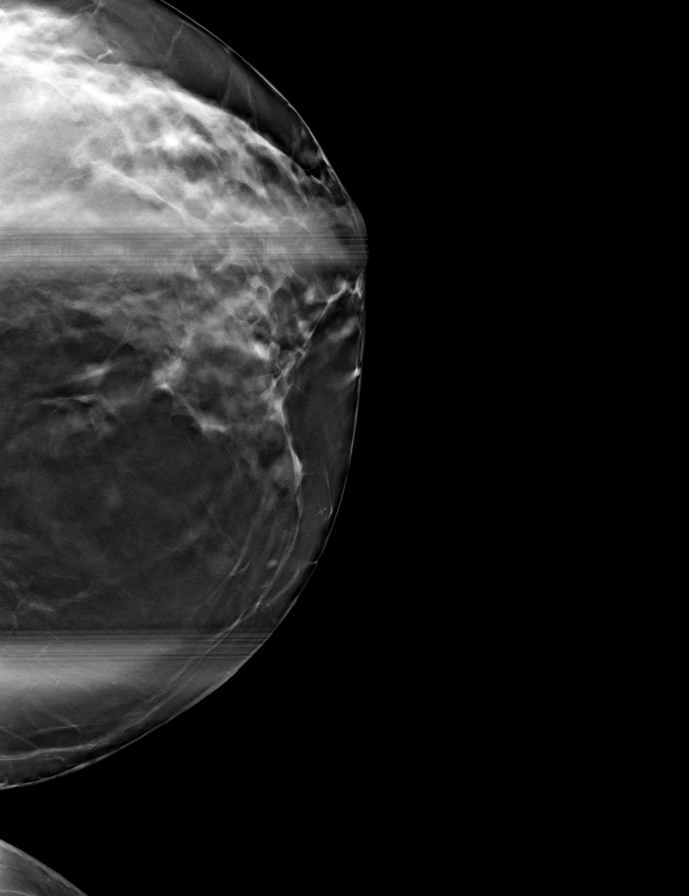

[R MLO tomo · tomo slice 46/91.0]
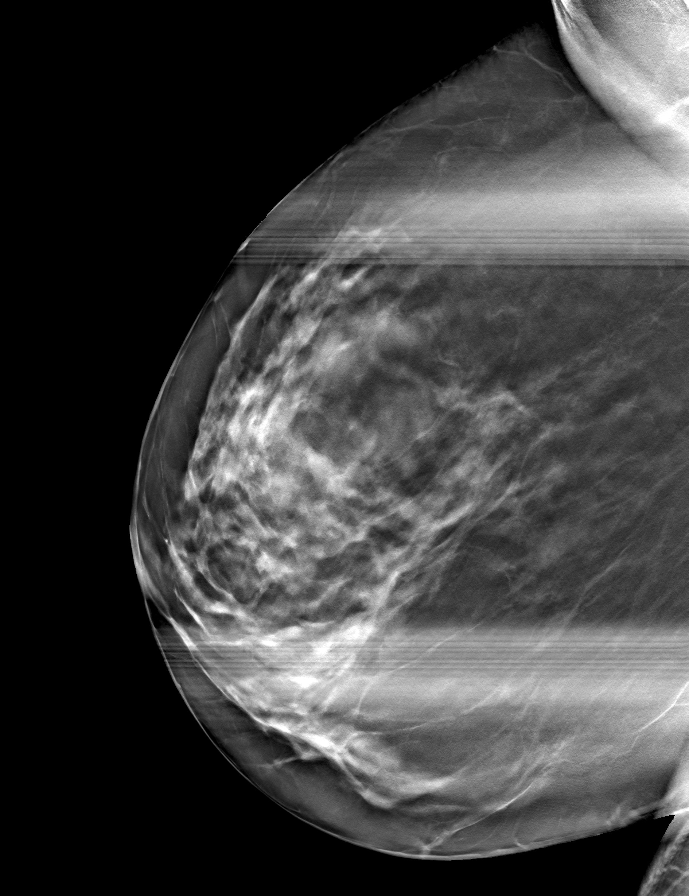

[8 of 24 positions shown; findings below may reference images not displayed]

ACR Breast Density Category c: The breast tissue is heterogeneously
dense, which may obscure small masses.
FINDINGS: Spot compression tomosynthesis images of the right breast
demonstrate a persistent oval obscured mass in the lateral posterior
depth. Spot compression tomosynthesis images of the asymmetry in the
medial anterior left breast demonstrates a persistent irregular
asymmetry/possible obscured mass. The correlate is not seen on the
true lateral view.

Mammographic images were processed with CAD.

Ultrasound of the right breast at 9 o'clock, 9 cm from the nipple
demonstrates an anechoic oval circumscribed mass measuring 1.3 x
x 1.1 cm.

Ultrasound of the left breast at 9 o'clock, 3 cm from the nipple
demonstrates a shadowing oval hypoechoic mass measuring 1.8 x 0.5 x
0.9 cm. A tiny amount of blood flow is seen on color Doppler
imaging. Ultrasound of the left axilla demonstrates multiple
normal-appearing lymph nodes.
IMPRESSION: 1.  There is an indeterminate left breast mass at 9 o'clock.

2.  No evidence of left axillary lymphadenopathy.

3. The mass in the right breast at 9 o'clock corresponds with a
benign cyst.

RECOMMENDATION:
Ultrasound guided biopsy is recommended for the left breast mass,
and has been scheduled for [DATE] at [DATE] p.m.

I have discussed the findings and recommendations with the patient.
If applicable, a reminder letter will be sent to the patient
regarding the next appointment.

BI-RADS CATEGORY  4: Suspicious.

## 2019-12-02 ENCOUNTER — Ambulatory Visit
Admission: RE | Admit: 2019-12-02 | Discharge: 2019-12-02 | Disposition: A | Discharge status: Home / Self Care | Payer: BC Managed Care – PPO | Source: Ambulatory Visit | Attending: Obstetrics and Gynecology | Admitting: Obstetrics and Gynecology

## 2019-12-02 ENCOUNTER — Other Ambulatory Visit: Payer: Self-pay | Admitting: Obstetrics and Gynecology

## 2019-12-02 DIAGNOSIS — N6320 Unspecified lump in the left breast, unspecified quadrant: Secondary | ICD-10-CM

## 2019-12-02 HISTORY — PX: BREAST BIOPSY: SHX20

## 2019-12-02 IMAGING — MG MM BREAST LOCALIZATION CLIP
4 series · 4 of 12 positions shown · non-contrast
Comparison: Previous exam(s).

CLINICAL DATA: Ultrasound-guided core needle biopsy was performed
mass in the 9 o'clock position of the left breast 3 cm from nipple.

EXAM:
DIAGNOSTIC LEFT MAMMOGRAM POST ULTRASOUND BIOPSY

[L ML synth-2D]
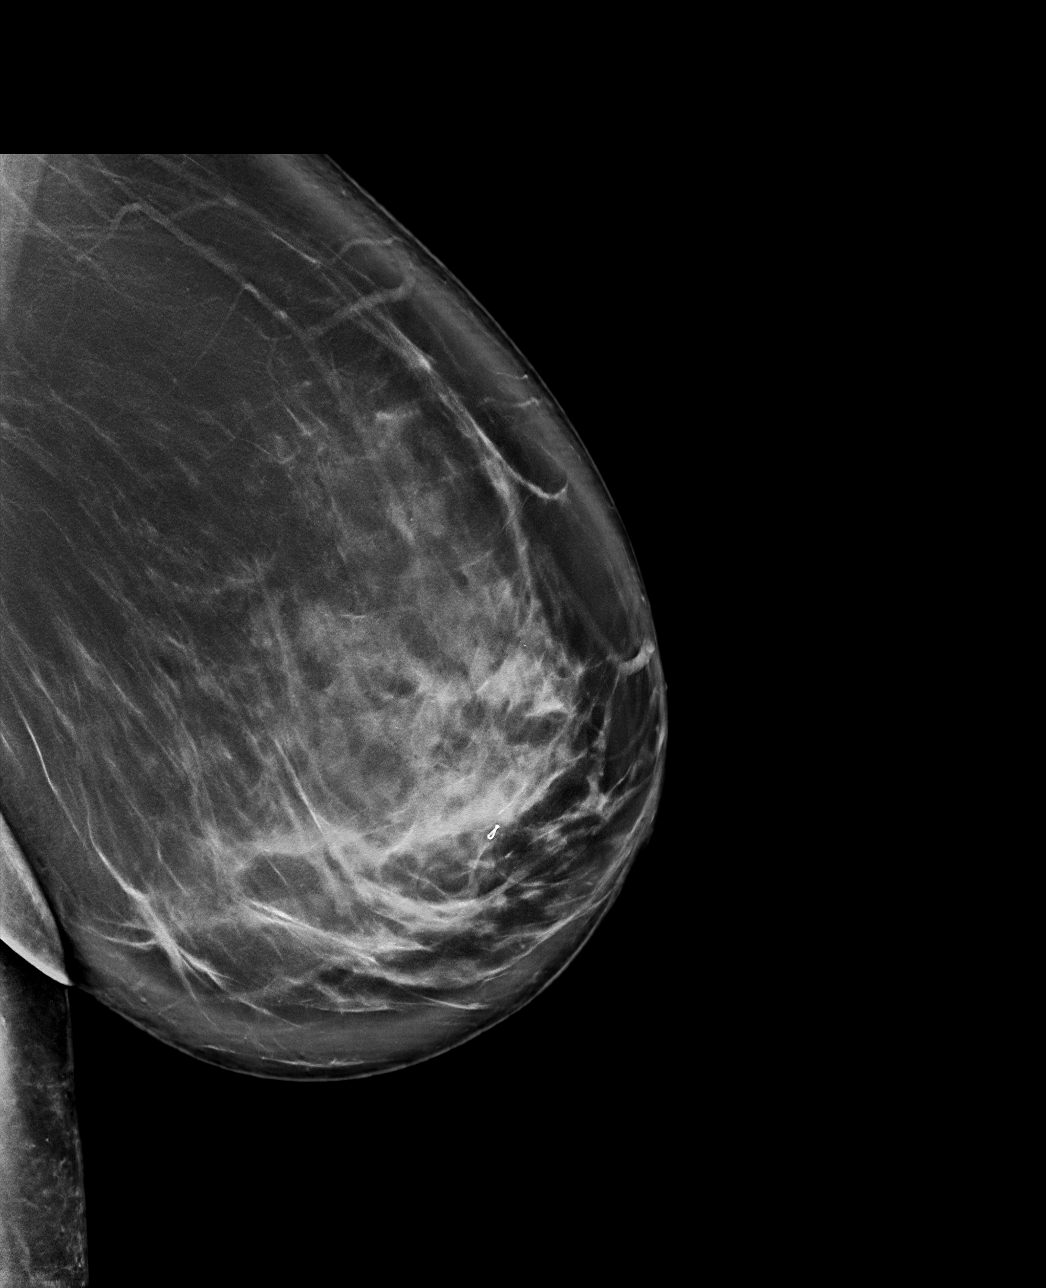

[L CC synth-2D]
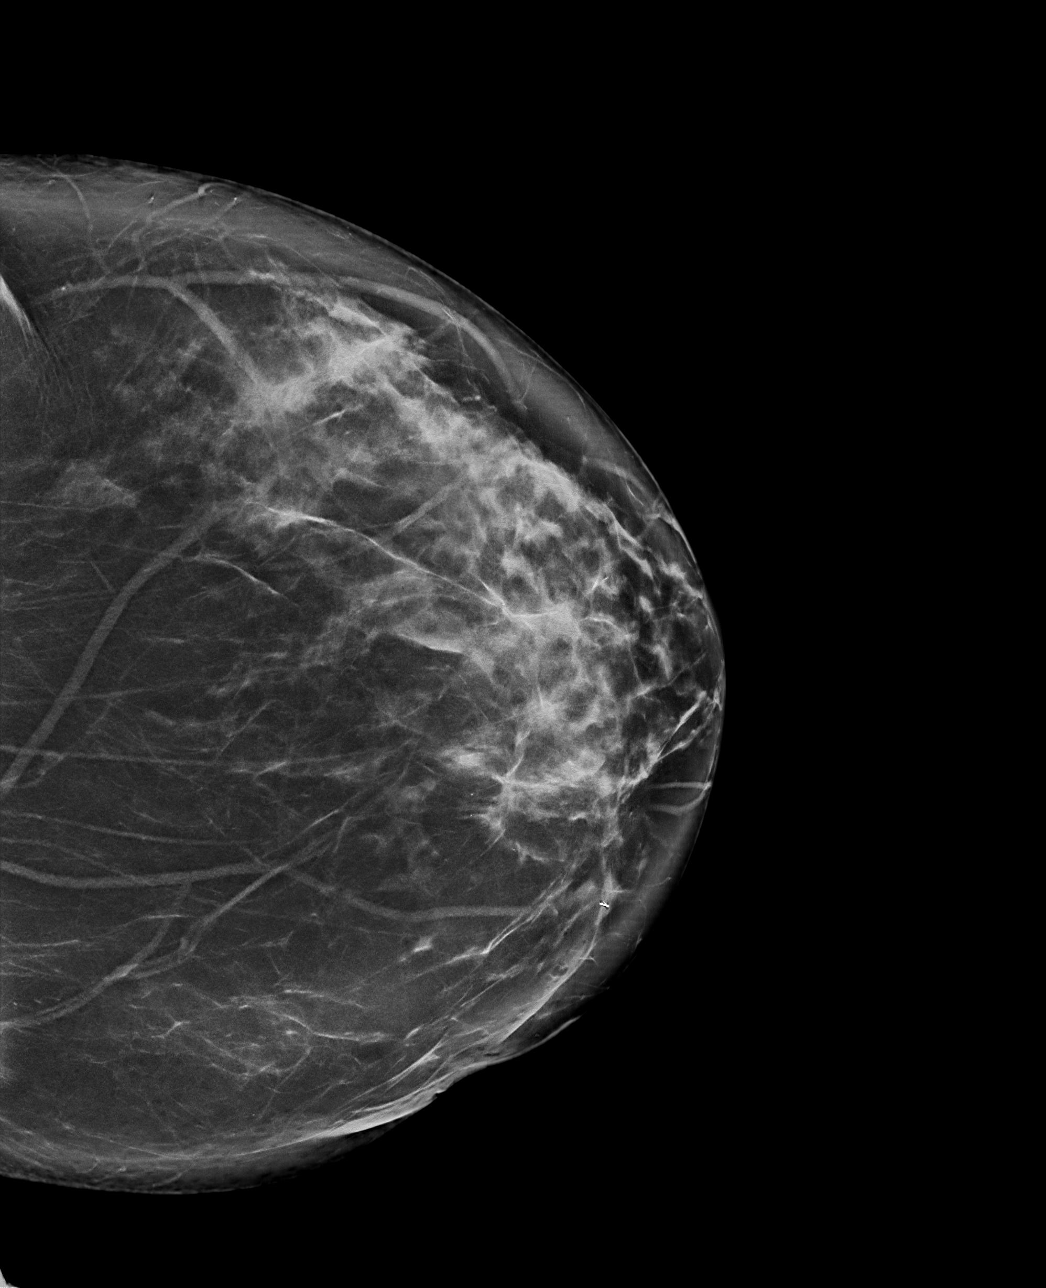

[L ML tomo · tomo slice 61/121.0]
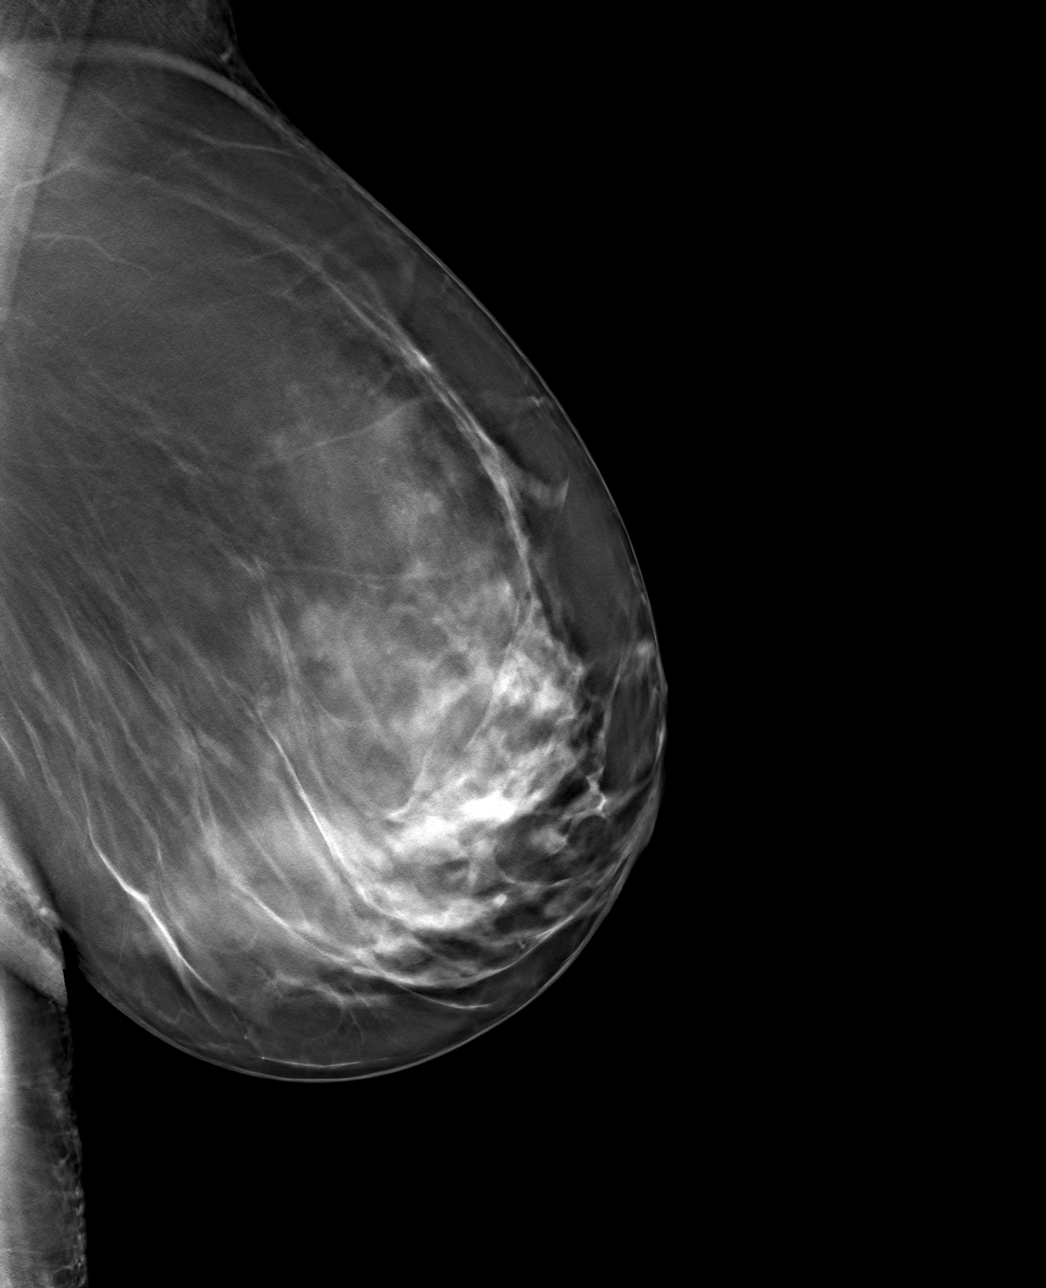

[L CC tomo · tomo slice 51/101.0]
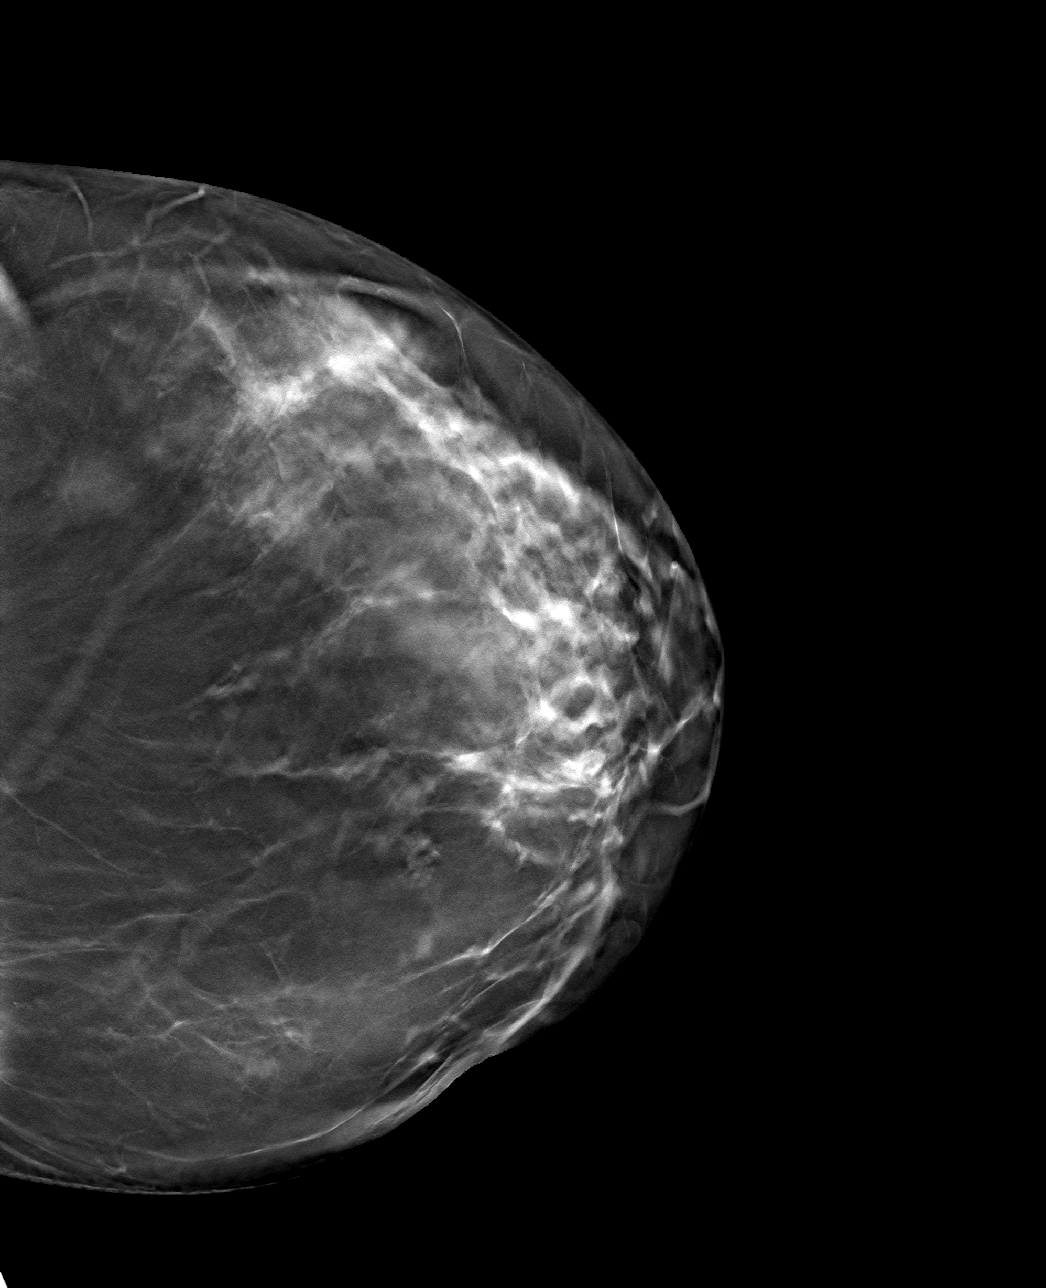

[4 of 12 positions shown; findings below may reference images not displayed]

FINDINGS: Mammographic images were obtained following ultrasound guided biopsy
of the left breast. The biopsy marking clip is in expected position
at the site of biopsy.
IMPRESSION: Appropriate positioning of the ribbon shaped biopsy marking clip at
the site of biopsy in the location.

Final Assessment: Post Procedure Mammograms for Marker Placement

## 2019-12-02 IMAGING — US US BREAST BX W LOC DEV 1ST LESION IMG BX SPEC US GUIDE*L*
1 series · 13 of 13 positions shown · non-contrast
Comparison: Previous exam(s).
COMPARISON: Previous exam(s).

Addendum:
CLINICAL DATA: Ultrasound-guided core needle biopsy was recommended
of a mass in the left breast at the 9 o'clock position approximately
3 cm from the nipple.

EXAM:
ULTRASOUND GUIDED LEFT BREAST CORE NEEDLE BIOPSY

[Series 1: us breast bx w loc dev 1st lesion img bx spec us g · 0.06mm/px · 13 of 13 slices shown]
[im 1/13]
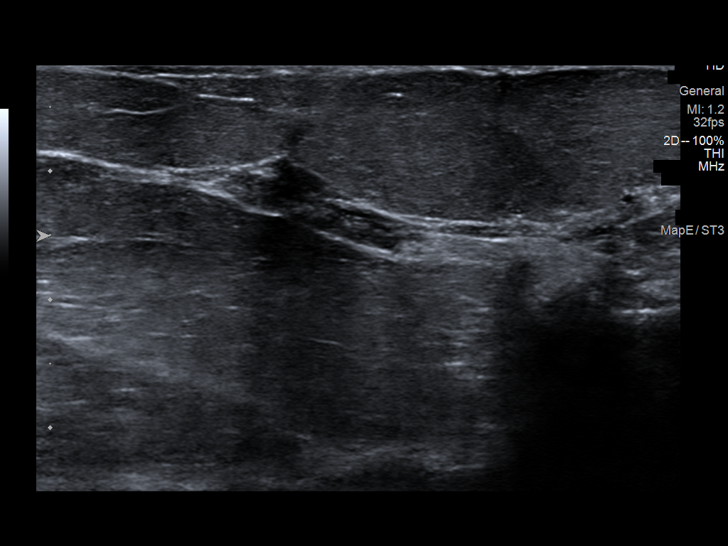
[im 2/13]
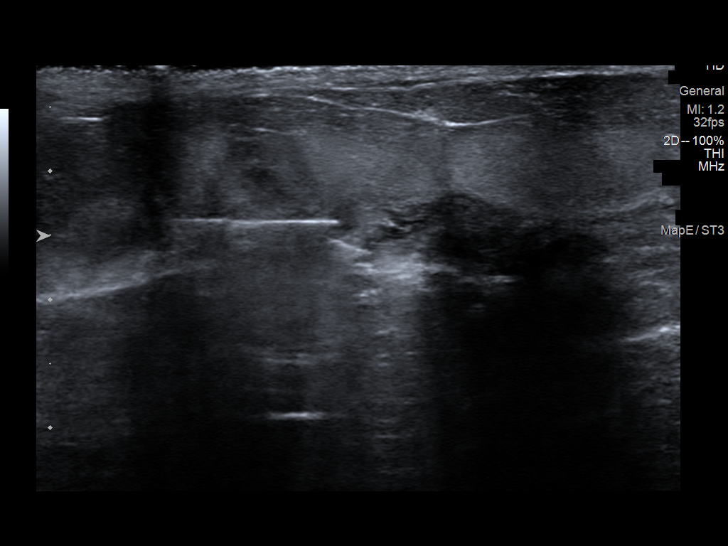
[im 3/13]
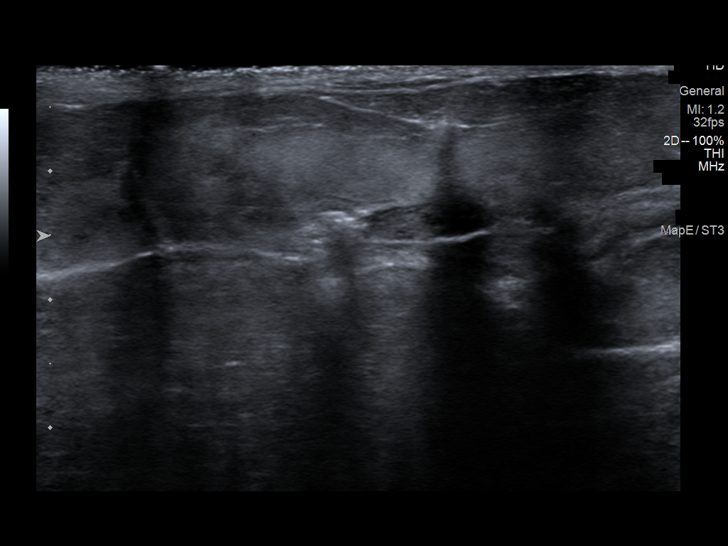
[im 4/13]
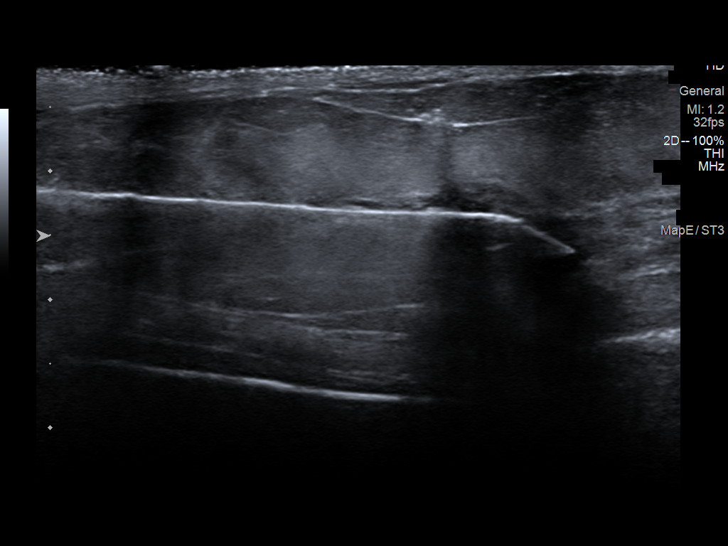
[im 5/13]
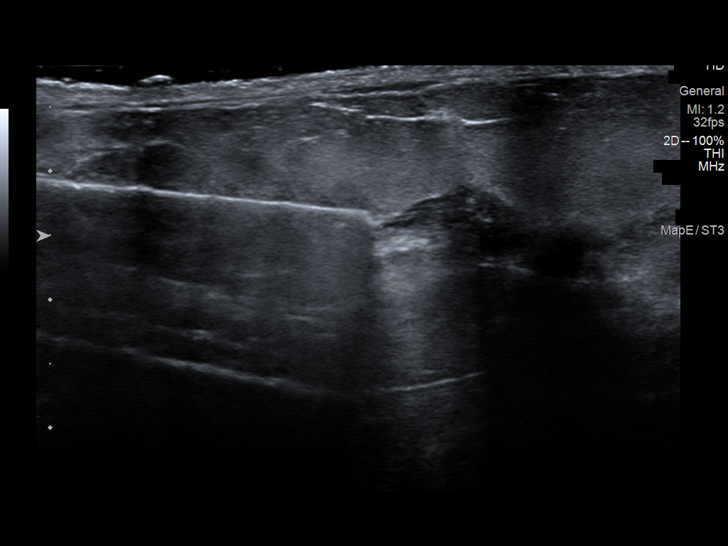
[im 6/13]
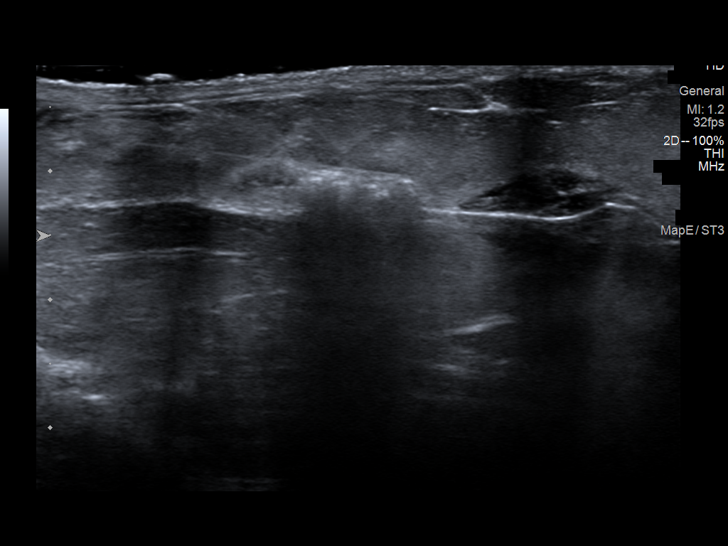
[im 7/13]
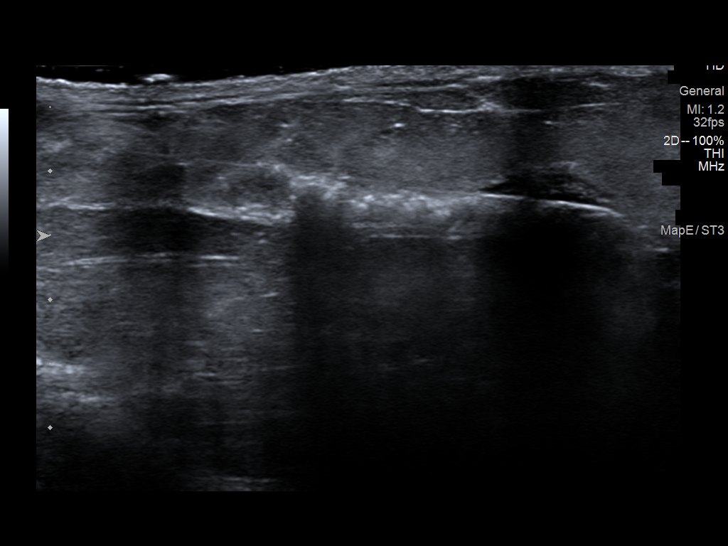
[im 8/13]
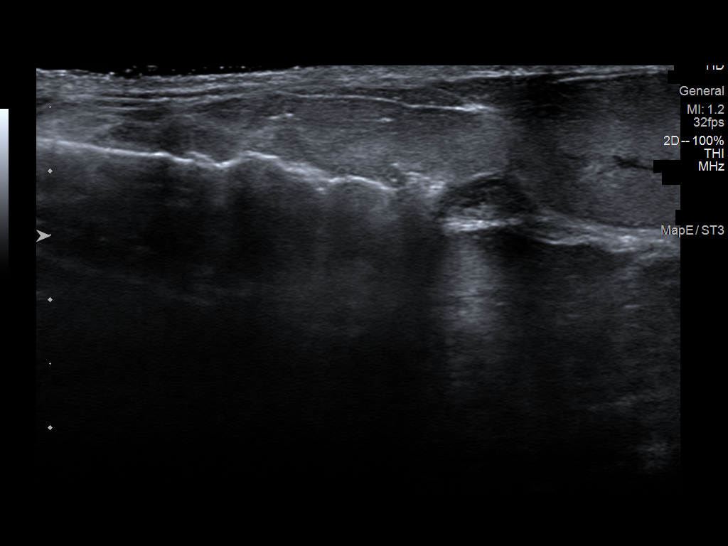
[im 9/13]
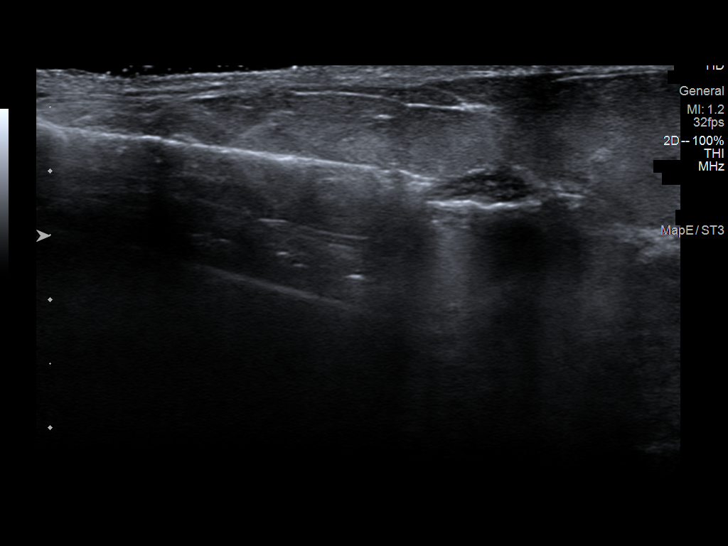
[im 10/13]
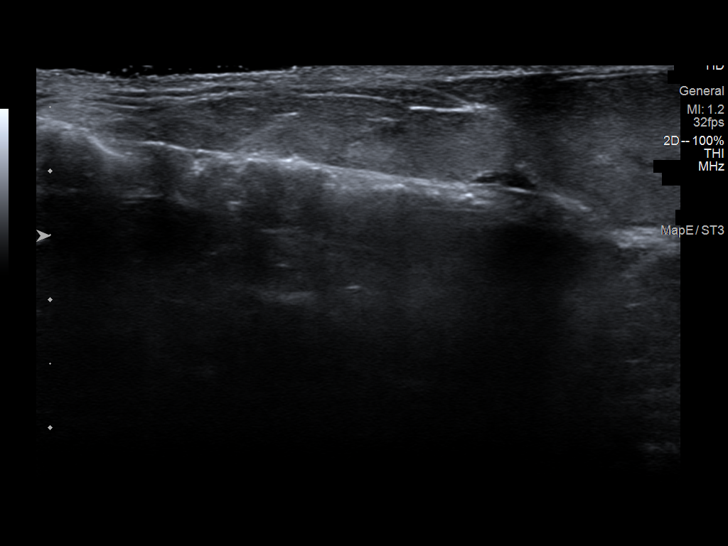
[im 11/13]
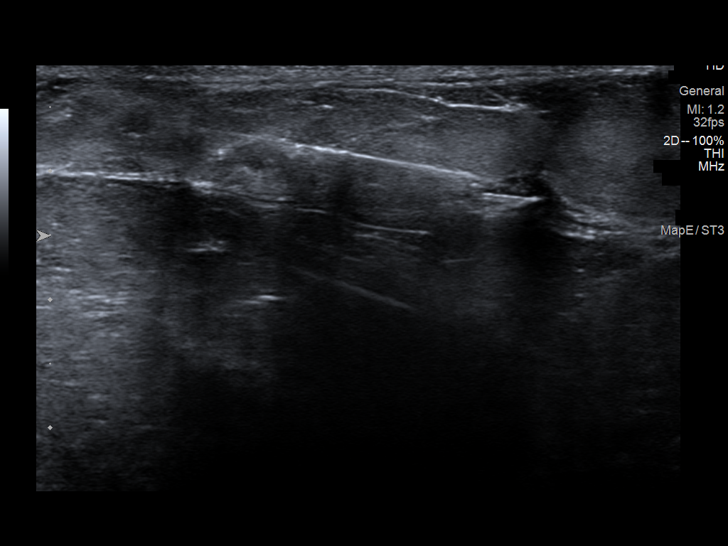
[im 12/13]
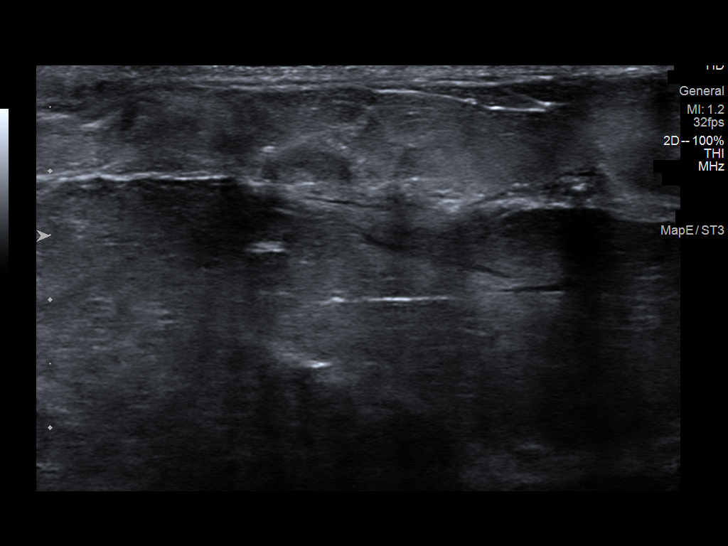
[im 13/13]
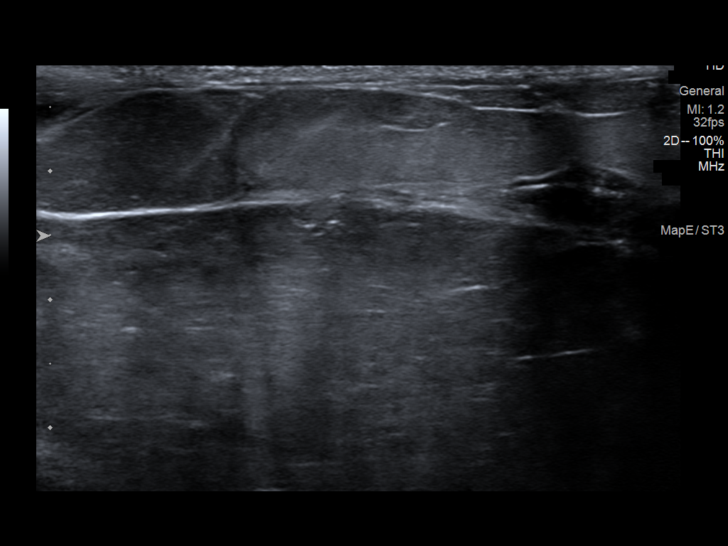

[13 of 13 positions shown; findings below may reference images not displayed]



Lesion quadrant: Lower inner quadrant

Using sterile technique and 1% Lidocaine as local anesthetic, under
direct ultrasound visualization, a 12 gauge ZEPHIR device was
used to perform biopsy of a mass at 9 o'clock position 3 cm from
nipple using a medial approach. At the conclusion of the procedure
ribbon tissue marker clip was deployed into the biopsy cavity.
Follow up 2 view mammogram was performed and dictated separately.
IMPRESSION: Ultrasound guided biopsy of the left breast. No apparent
complications.

ADDENDUM:
Pathology revealed FIBROCYSTIC CHANGE, FIBROADENOMATOID CHANGE,
PSEUDOANGIOMATOUS STROMAL HYPERPLASIA (PASH) of the Left breast, 9
o'clock, [FU]. This was found to be concordant by Dr. ZEPHIR.

Pathology results were discussed with the patient by telephone by
ZEPHIR, RN, Nurse Navigator. The patient reported doing well
after the biopsy with tenderness at the site. Post biopsy
instructions and care were reviewed and questions were answered. The
patient was encouraged to call [REDACTED] for any additional concerns.

The patient was instructed to return for annual screening

Pathology results reported by ZEPHIR, RN on [DATE].



Lesion quadrant: Lower inner quadrant

Using sterile technique and 1% Lidocaine as local anesthetic, under
direct ultrasound visualization, a 12 gauge ZEPHIR device was
used to perform biopsy of a mass at 9 o'clock position 3 cm from
nipple using a medial approach. At the conclusion of the procedure
ribbon tissue marker clip was deployed into the biopsy cavity.
Follow up 2 view mammogram was performed and dictated separately.
IMPRESSION: Ultrasound guided biopsy of the left breast. No apparent
complications.

## 2020-01-05 ENCOUNTER — Other Ambulatory Visit: Payer: Self-pay

## 2020-01-05 ENCOUNTER — Ambulatory Visit: Payer: BC Managed Care – PPO | Attending: Internal Medicine

## 2020-01-05 DIAGNOSIS — Z20822 Contact with and (suspected) exposure to covid-19: Secondary | ICD-10-CM

## 2020-01-06 LAB — NOVEL CORONAVIRUS, NAA: SARS-CoV-2, NAA: NOT DETECTED

## 2020-12-14 ENCOUNTER — Other Ambulatory Visit: Payer: Self-pay | Admitting: *Deleted

## 2020-12-14 DIAGNOSIS — N6320 Unspecified lump in the left breast, unspecified quadrant: Secondary | ICD-10-CM

## 2021-01-25 ENCOUNTER — Ambulatory Visit: Payer: BC Managed Care – PPO

## 2021-01-25 ENCOUNTER — Other Ambulatory Visit: Payer: Self-pay

## 2021-01-25 ENCOUNTER — Ambulatory Visit
Admission: RE | Admit: 2021-01-25 | Discharge: 2021-01-25 | Disposition: A | Discharge status: Home / Self Care | Payer: BC Managed Care – PPO | Source: Ambulatory Visit | Attending: *Deleted | Admitting: *Deleted

## 2021-01-25 DIAGNOSIS — N6320 Unspecified lump in the left breast, unspecified quadrant: Secondary | ICD-10-CM

## 2021-01-25 IMAGING — MG DIGITAL DIAGNOSTIC BILAT W/ TOMO W/ CAD
6 of 10 series · 6 of 30 positions shown · non-contrast
Comparison: Previous exam(s).

CLINICAL DATA: 44-year-old female with a palpable lump in the
lateral left breast approximately 1 month ago, which has completely
resolved in the interim.

EXAM:
DIGITAL DIAGNOSTIC BILATERAL MAMMOGRAM WITH TOMO AND CAD
TECHNIQUE: Bilateral digital diagnostic mammography and breast tomosynthesis
was performed. Digital images of the breasts were evaluated with
computer-aided detection.

[L CC synth-2D]
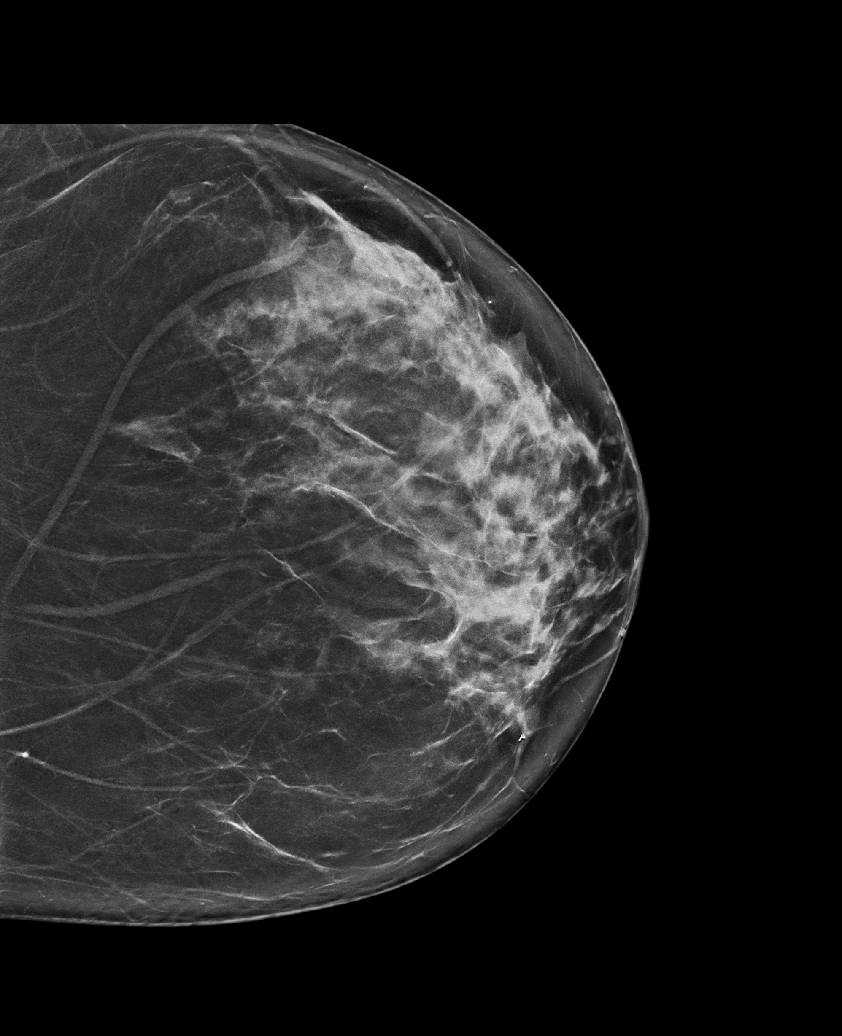

[L MLO synth-2D (1 of 2)]
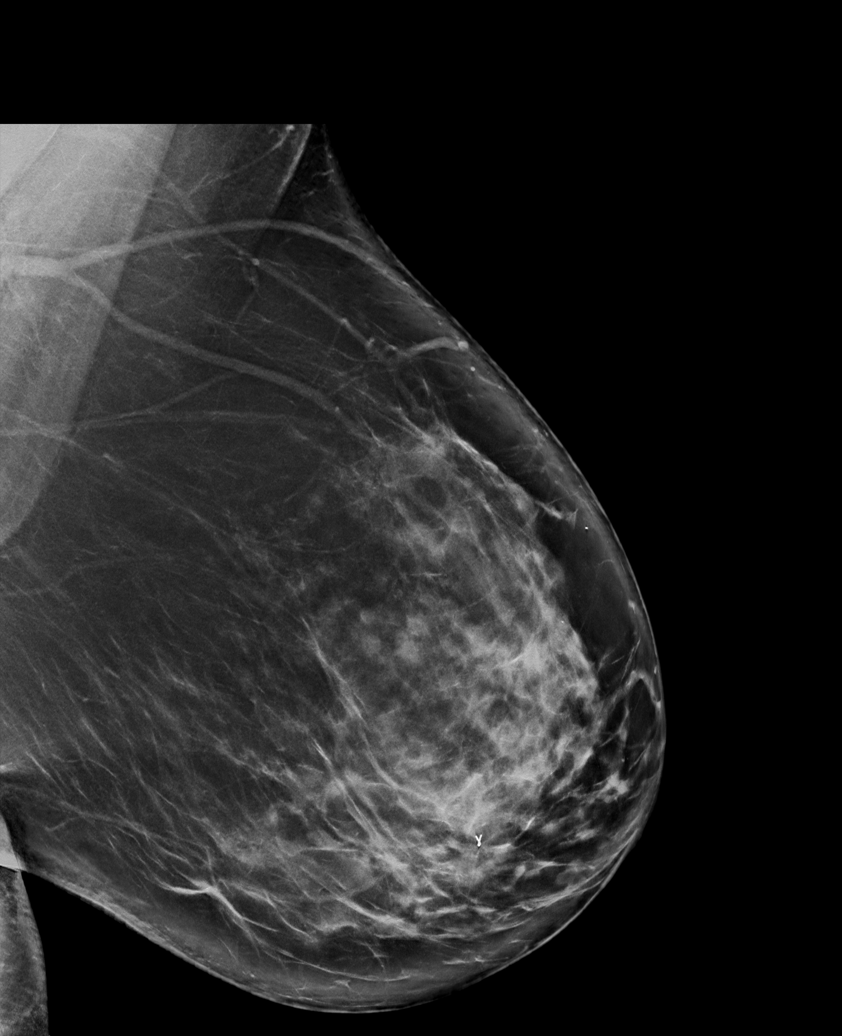

[L MLO synth-2D (2 of 2)]
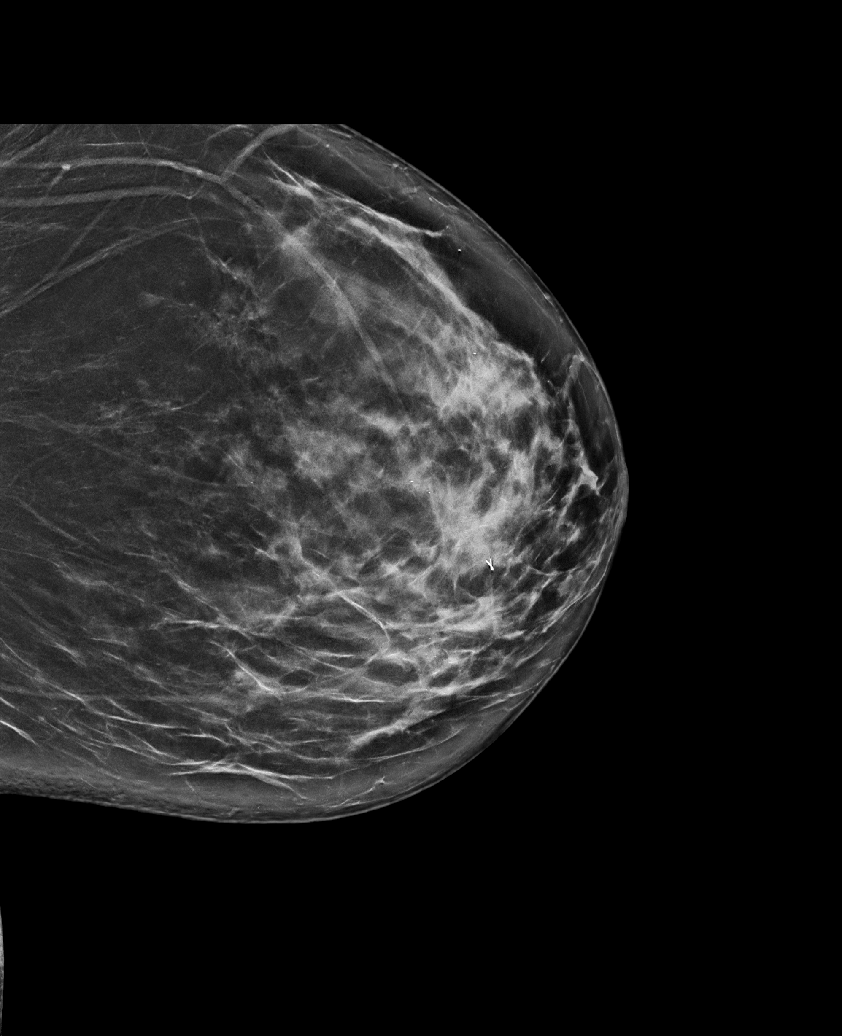

[R CC synth-2D]
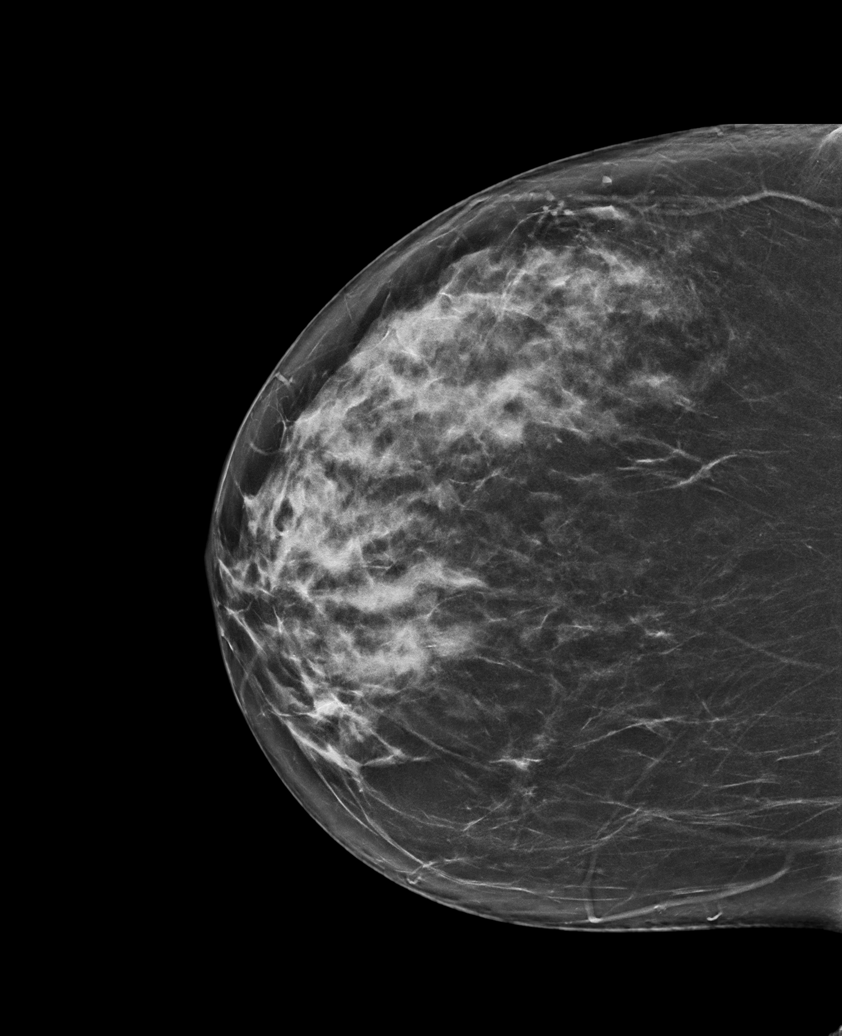

[R MLO synth-2D]
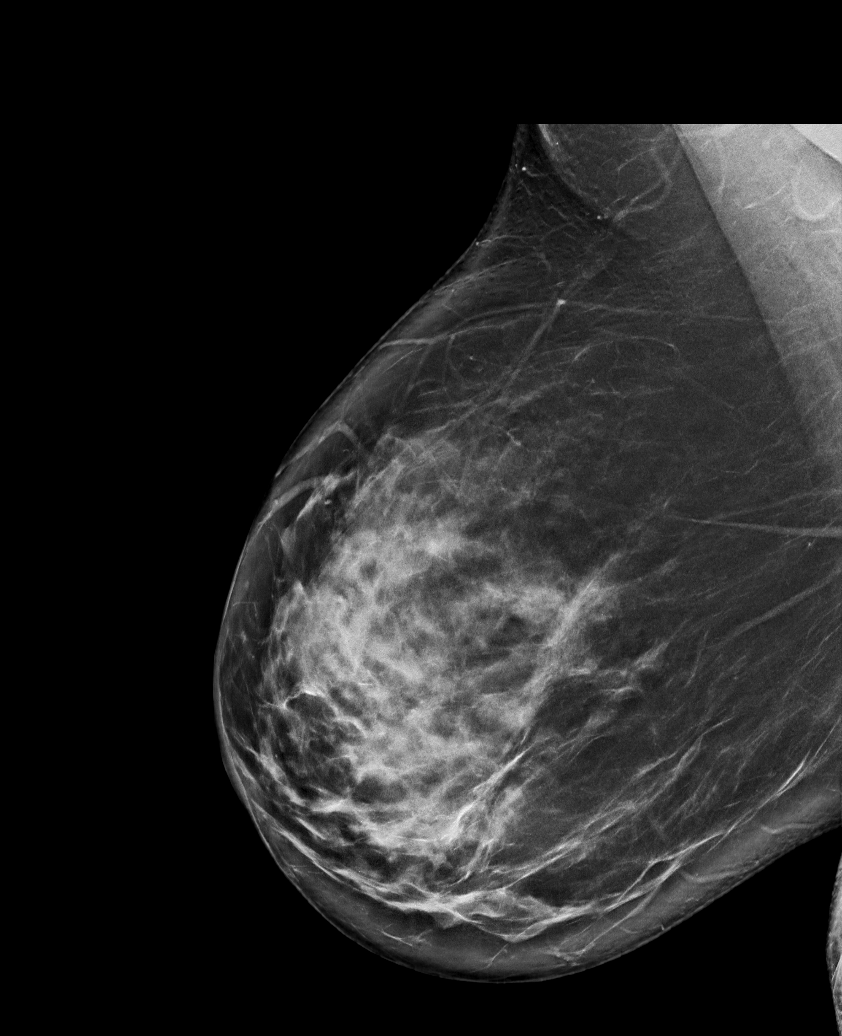

[L MLO tomo · tomo slice 51/102.0]
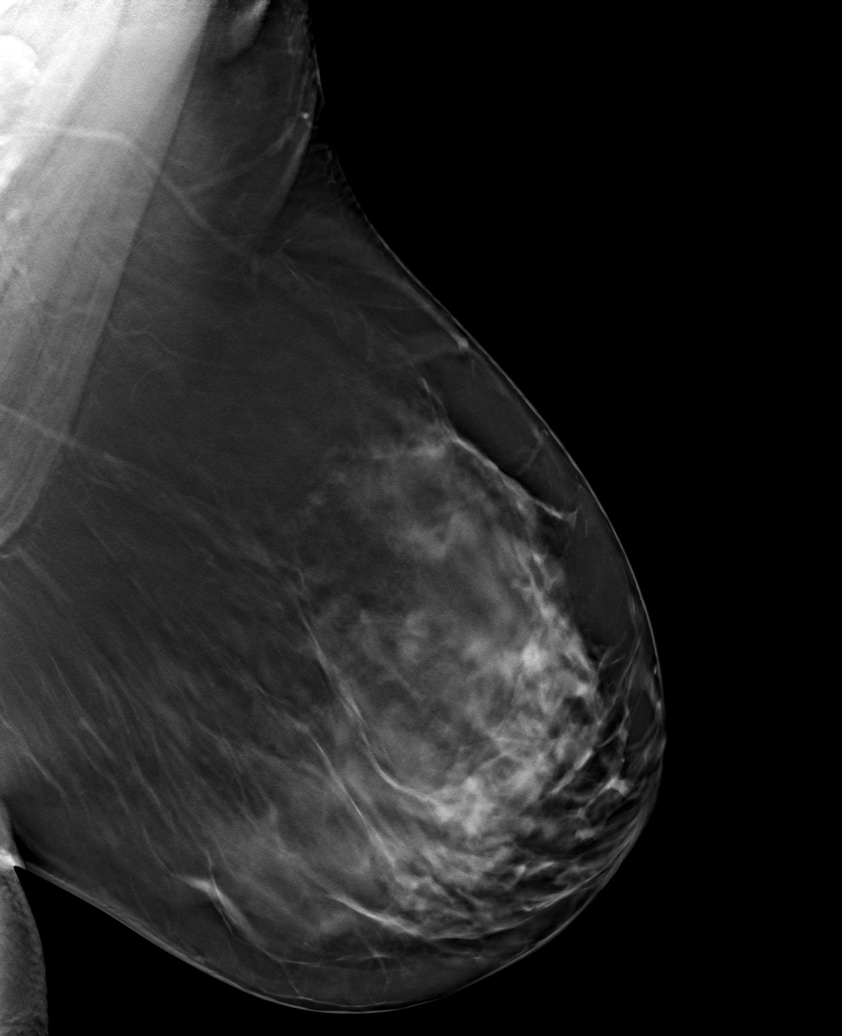

[6 of 30 positions shown; findings below may reference images not displayed]

ACR Breast Density Category c: The breast tissue is heterogeneously
dense, which may obscure small masses.
FINDINGS: No suspicious mammographic findings are identified in either breast.
The parenchymal pattern is stable.
IMPRESSION: No mammographic evidence of malignancy in either breast.

RECOMMENDATION:
1. Clinical follow-up recommended for the palpable area of concern
in the left breast. Any further workup should be based on clinical
grounds.
2.  Screening mammogram in one year.(Code:[AE])

I have discussed the findings and recommendations with the patient.
If applicable, a reminder letter will be sent to the patient
regarding the next appointment.

BI-RADS CATEGORY  1: Negative.

## 2021-04-02 ENCOUNTER — Other Ambulatory Visit: Payer: Self-pay | Admitting: Neurosurgery

## 2021-04-02 DIAGNOSIS — G8929 Other chronic pain: Secondary | ICD-10-CM

## 2021-04-02 DIAGNOSIS — M545 Low back pain, unspecified: Secondary | ICD-10-CM

## 2021-04-05 ENCOUNTER — Other Ambulatory Visit: Payer: Self-pay | Admitting: Neurosurgery

## 2021-04-05 DIAGNOSIS — M545 Low back pain, unspecified: Secondary | ICD-10-CM

## 2021-04-05 DIAGNOSIS — G8929 Other chronic pain: Secondary | ICD-10-CM

## 2021-04-20 ENCOUNTER — Ambulatory Visit
Admission: RE | Admit: 2021-04-20 | Discharge: 2021-04-20 | Disposition: A | Discharge status: Home / Self Care | Payer: BC Managed Care – PPO | Source: Ambulatory Visit | Attending: Neurosurgery | Admitting: Neurosurgery

## 2021-04-20 DIAGNOSIS — M545 Low back pain, unspecified: Secondary | ICD-10-CM

## 2021-04-20 DIAGNOSIS — G8929 Other chronic pain: Secondary | ICD-10-CM

## 2021-04-20 IMAGING — MR MR LUMBAR SPINE W/O CM
4 of 5 series · 25 of 48 positions shown · non-contrast
Comparison: None.

CLINICAL DATA: Low back pain for 8 months.  Hand numbness.

EXAM:
MRI LUMBAR SPINE WITHOUT CONTRAST
TECHNIQUE: Multiplanar, multisequence MR imaging of the lumbar spine was
performed. No intravenous contrast was administered.

[Series 4: T1 · sagittal · 4.0mm · 0.55mm/px · 6 of 13 slices shown (1 of 2)]
[im 1/13]
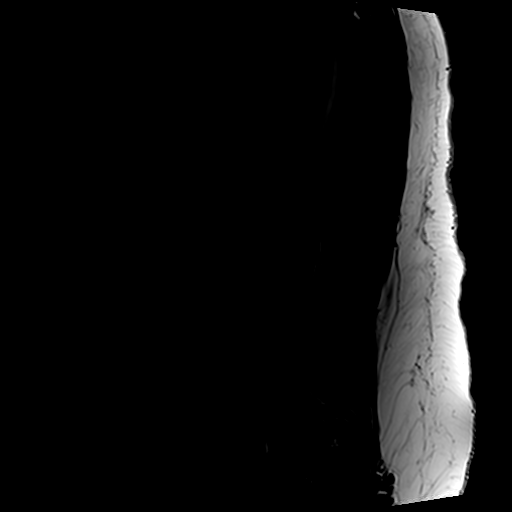
[im 3/13]
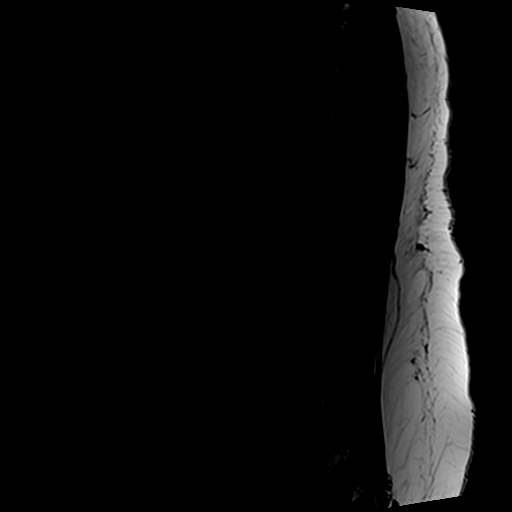
[im 5/13]
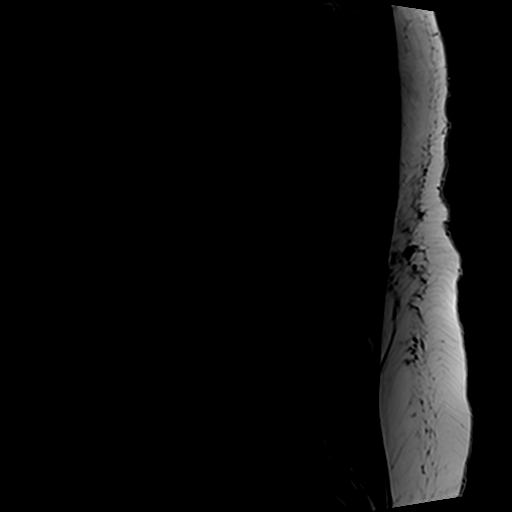
[im 8/13]
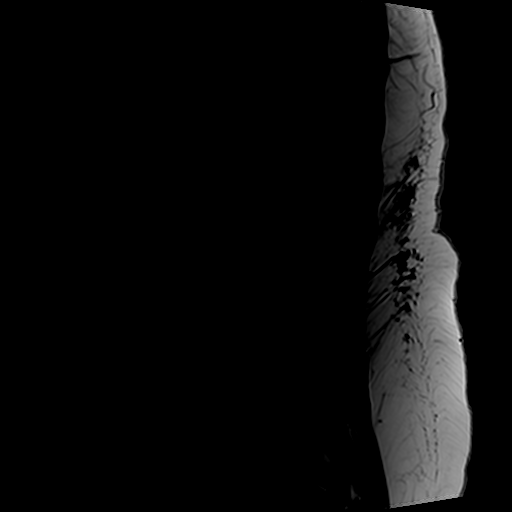
[im 10/13]
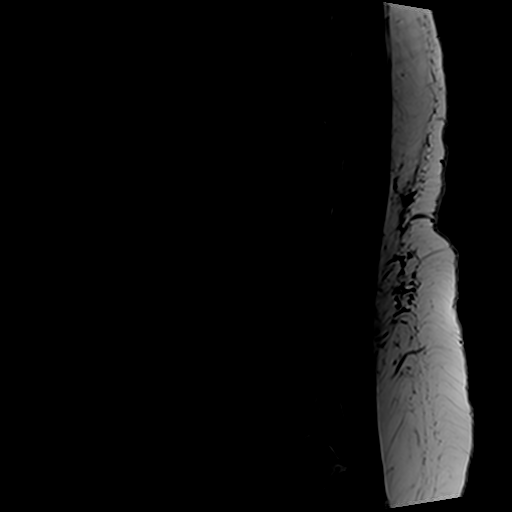
[im 13/13]
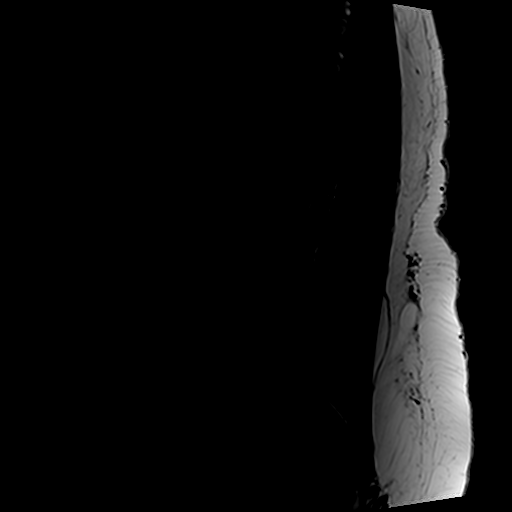

[Series 5: T2 · sagittal · 4.0mm · 0.55mm/px · 6 of 13 slices shown (1 of 2)]
[im 1/13]
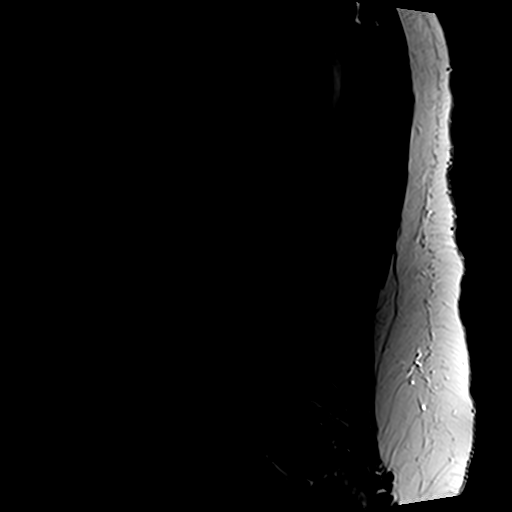
[im 3/13]
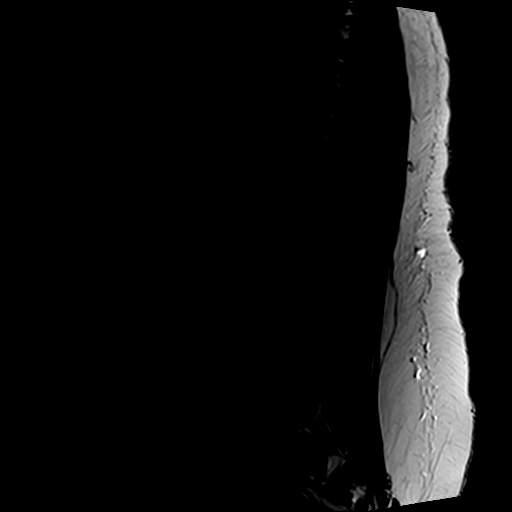
[im 5/13]
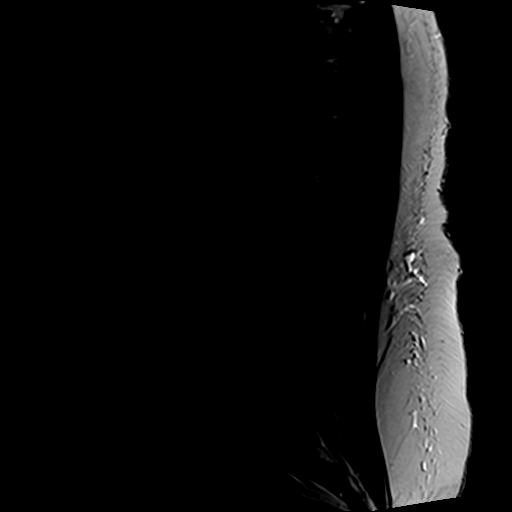
[im 8/13]
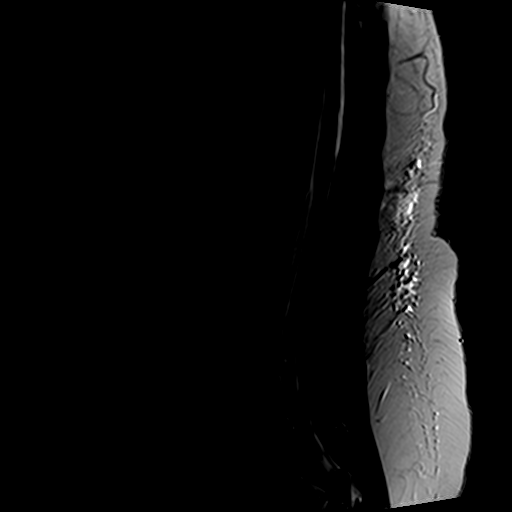
[im 10/13]
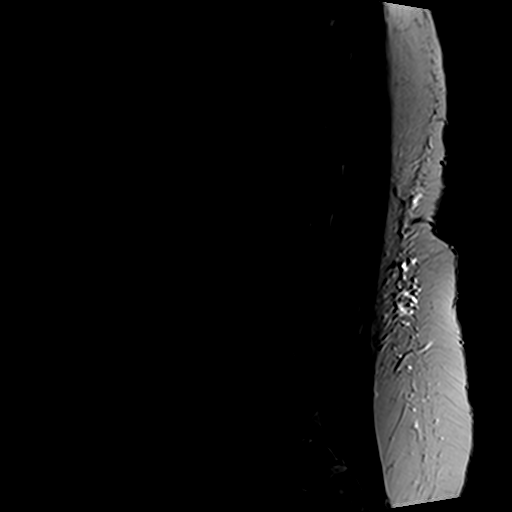
[im 13/13]
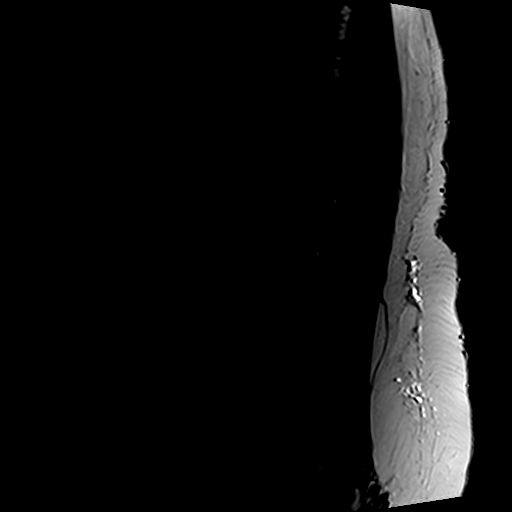

[Series 6: T2 · axial · 4.0mm · 0.70mm/px · z∈[-3,+202]mm · 9 of 35 slices shown (2 of 2)]
[im 1/35]
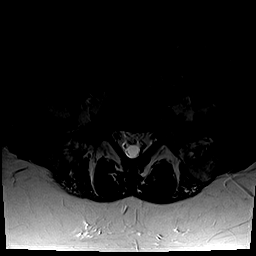
[im 5/35]
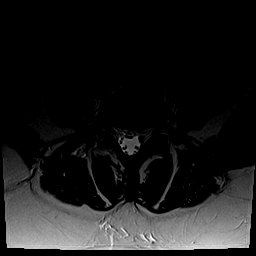
[im 10/35]
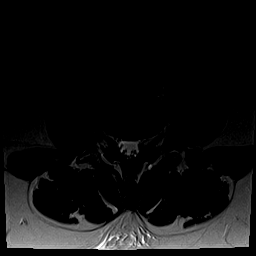
[im 15/35]
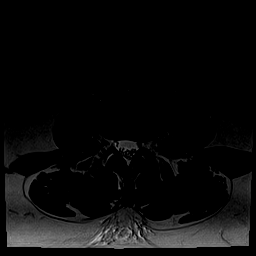
[im 18/35]
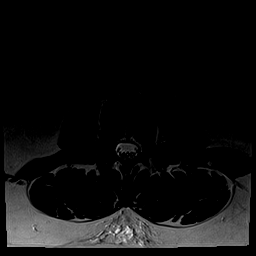
[im 20/35]
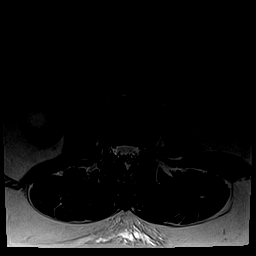
[im 25/35]
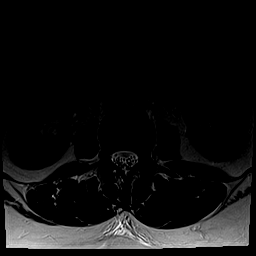
[im 30/35]
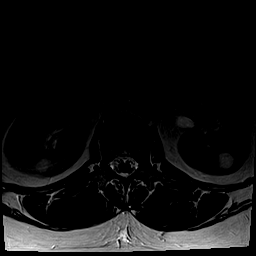
[im 35/35]
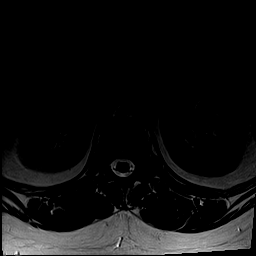

[Series 7: T1 · axial · 4.0mm · 0.35mm/px · z∈[-3,+177]mm · 4 of 35 slices shown (2 of 2)]
[im 1/35]
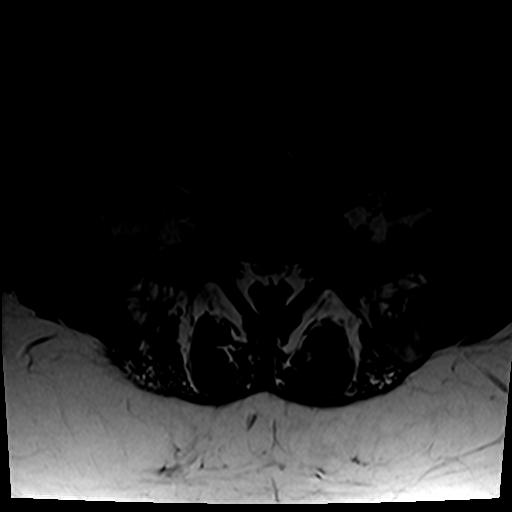
[im 5/35]
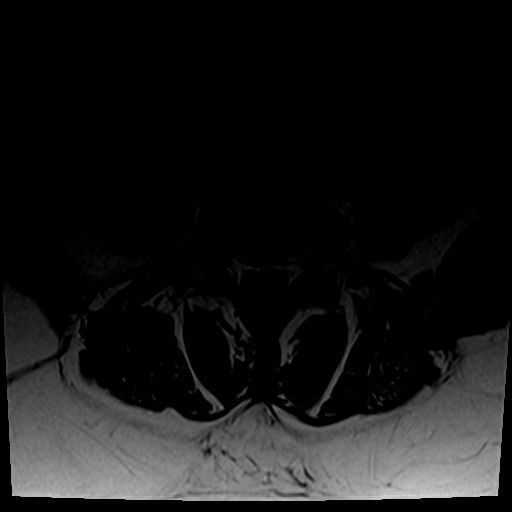
[im 18/35]
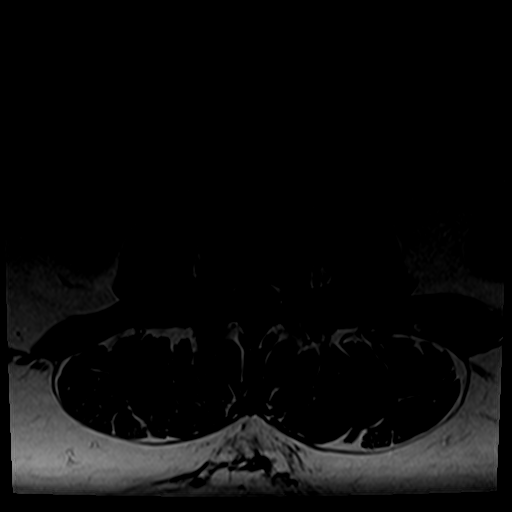
[im 30/35]
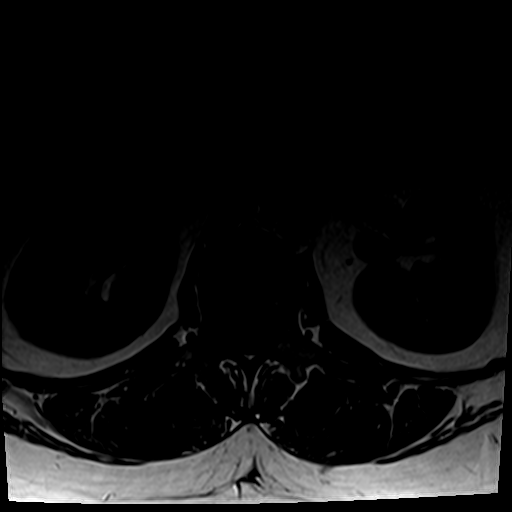

[25 of 48 positions shown; findings below may reference images not displayed]

FINDINGS: Segmentation: 5 lumbar type vertebrae based on the available
coverage

Alignment:  Normal

Vertebrae: No fracture, evidence of discitis, or bone lesion.
Discogenic endplate edema at L5-S1.

Conus medullaris and cauda equina: Conus extends to the L1-2 level.
Conus and cauda equina appear normal.

Paraspinal and other soft tissues: Bilateral renal cystic
intensities. No perispinal mass or inflammation

Disc levels:

Focal L5-S1 disc narrowing and endplate degeneration with
desiccation. Right paracentral protrusion contacting the right S1
nerve root with posterior displacement.
IMPRESSION: Focal disc degeneration at L5-S1 with right paracentral protrusion
impinging on the right S1 nerve root.

## 2021-08-20 ENCOUNTER — Other Ambulatory Visit: Payer: Self-pay | Admitting: Medical

## 2021-08-20 DIAGNOSIS — M25562 Pain in left knee: Secondary | ICD-10-CM

## 2021-08-21 ENCOUNTER — Ambulatory Visit
Admission: RE | Admit: 2021-08-21 | Discharge: 2021-08-21 | Disposition: A | Discharge status: Home / Self Care | Payer: 59 | Source: Ambulatory Visit | Attending: Medical | Admitting: Medical

## 2021-08-21 DIAGNOSIS — M25562 Pain in left knee: Secondary | ICD-10-CM

## 2021-08-21 IMAGING — MR MR KNEE*L* W/O CM
5 of 7 series · 25 of 40 positions shown · non-contrast
Comparison: None.

CLINICAL DATA: Left knee pain after twisting injury on [DATE]

EXAM:
MRI OF THE LEFT KNEE WITHOUT CONTRAST
TECHNIQUE: Multiplanar, multisequence MR imaging of the knee was performed. No
intravenous contrast was administered.

[Series 4: T2 fat-sat · coronal · 4.0mm · 0.70mm/px · 3 of 15 slices shown (1 of 3)]
[im 1/15]
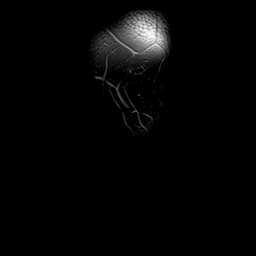
[im 8/15]
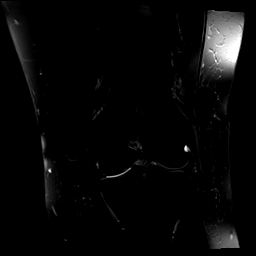
[im 15/15]
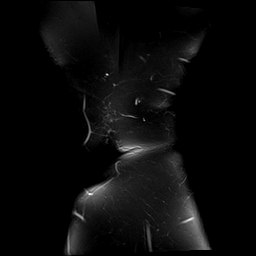

[Series 5: T1 · coronal · 4.0mm · 0.35mm/px · 4 of 29 slices shown]
[im 1/29]
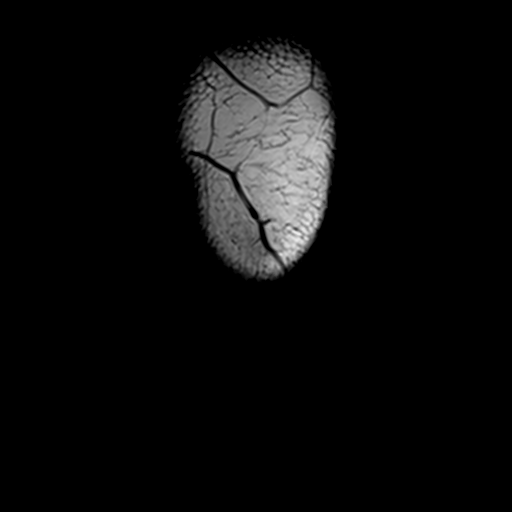
[im 6/29]
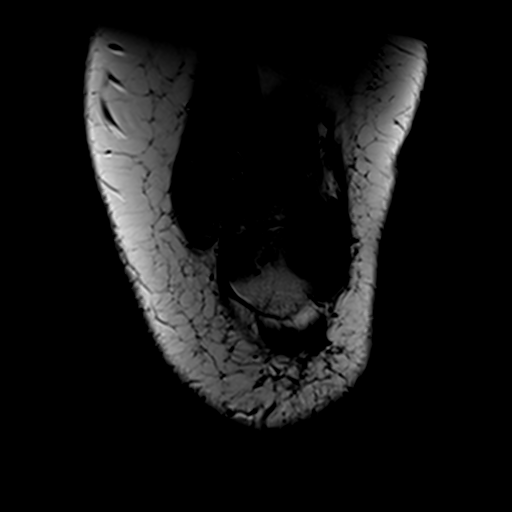
[im 12/29]
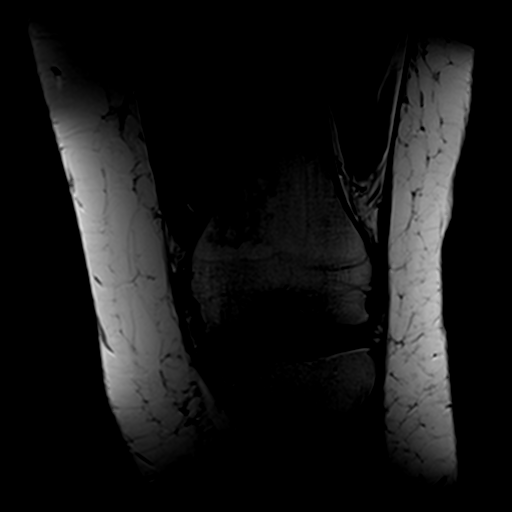
[im 17/29]
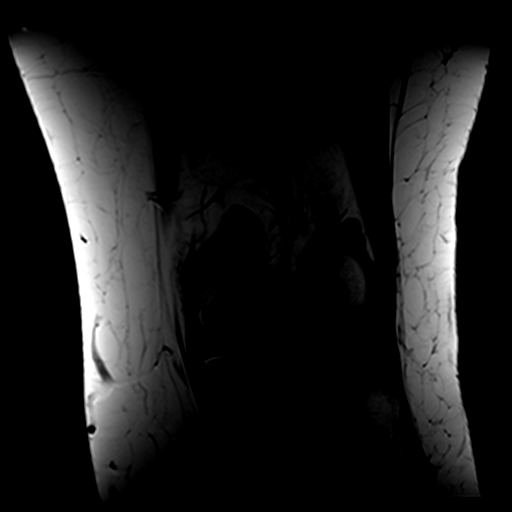

[Series 6: T2 fat-sat · coronal · 4.0mm · 0.70mm/px · 6 of 29 slices shown (2 of 3)]
[im 1/29]
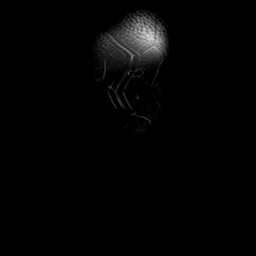
[im 6/29]
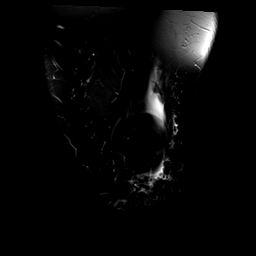
[im 12/29]
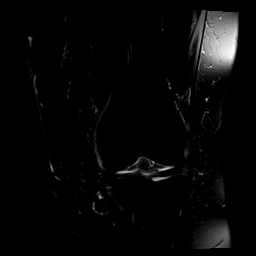
[im 17/29]
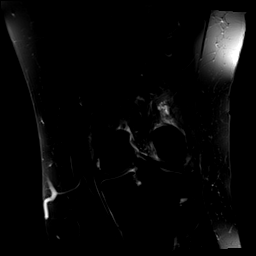
[im 23/29]
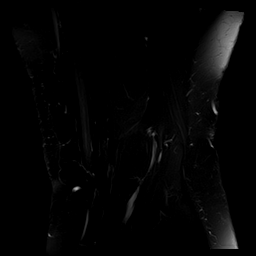
[im 29/29]
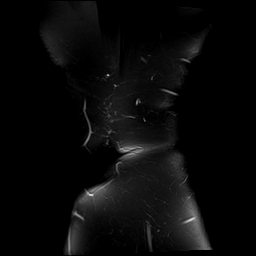

[Series 8: PD fat-sat · sagittal · 3.0mm · 0.29mm/px · 6 of 27 slices shown]
[im 1/27]
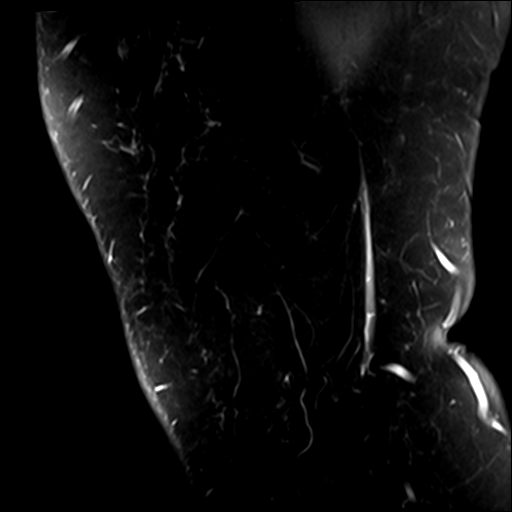
[im 6/27]
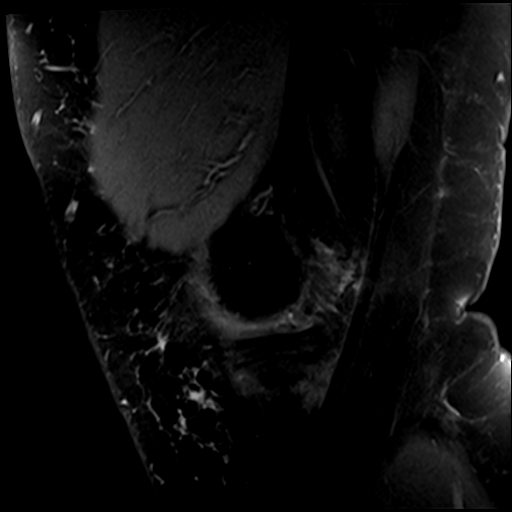
[im 11/27]
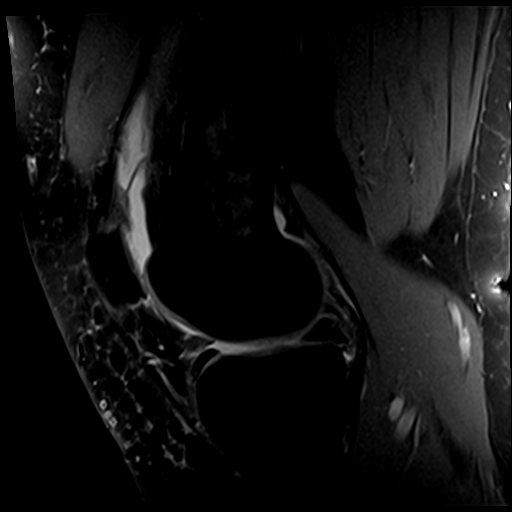
[im 16/27]
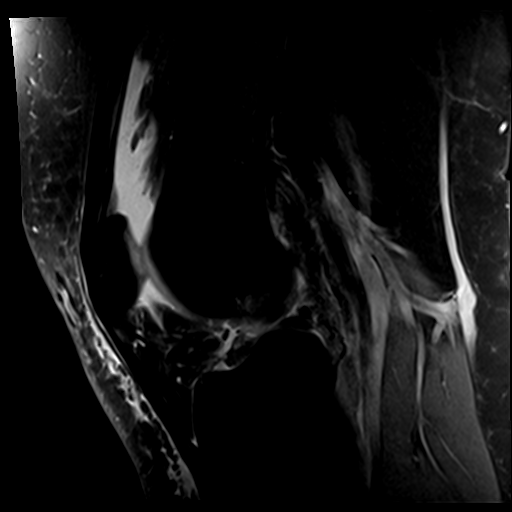
[im 21/27]
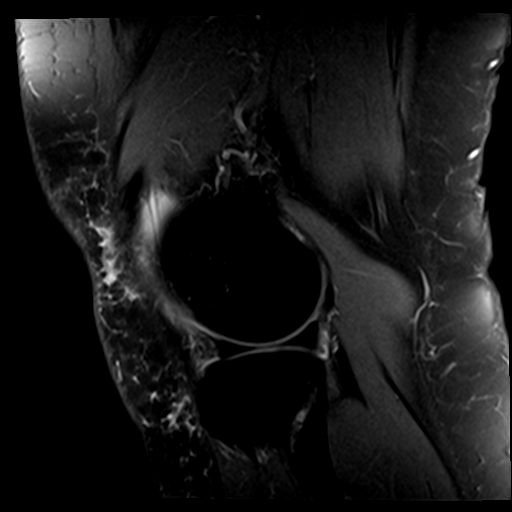
[im 27/27]
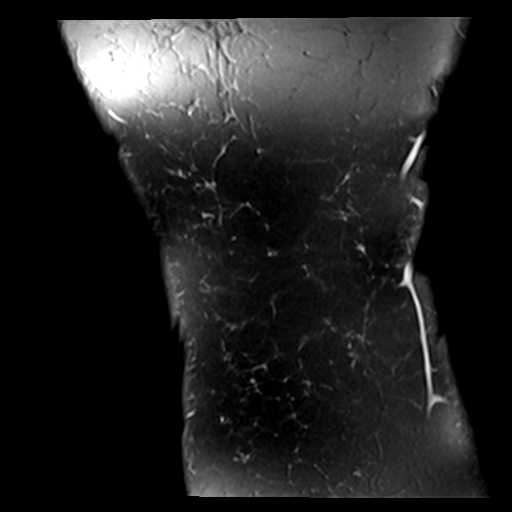

[Series 9: T2 fat-sat · sagittal · 3.0mm · 0.29mm/px · 6 of 27 slices shown (3 of 3)]
[im 1/27]
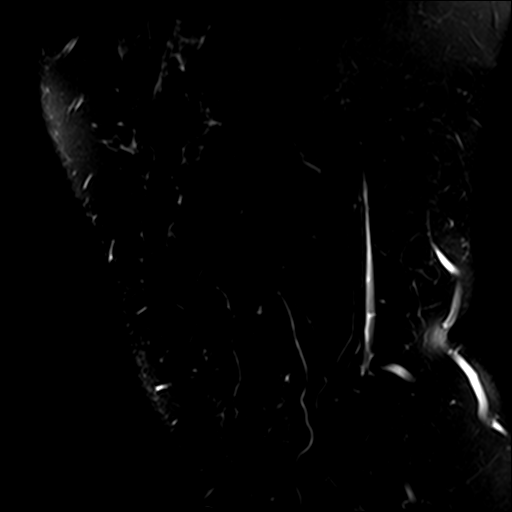
[im 6/27]
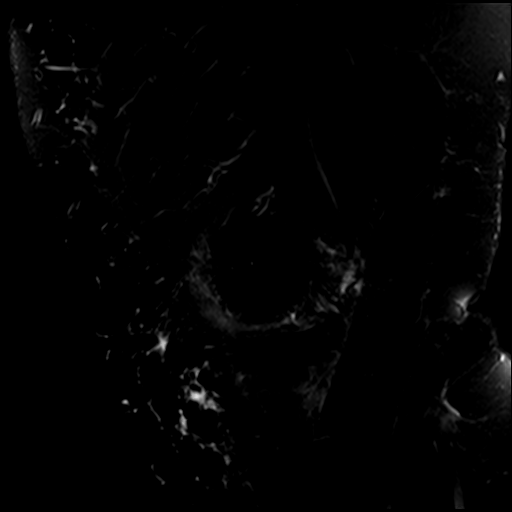
[im 11/27]
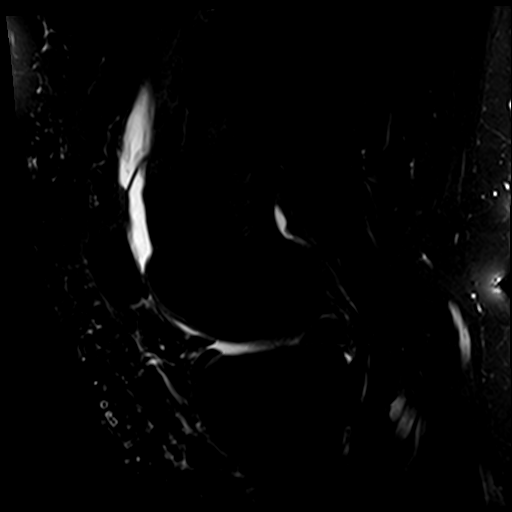
[im 16/27]
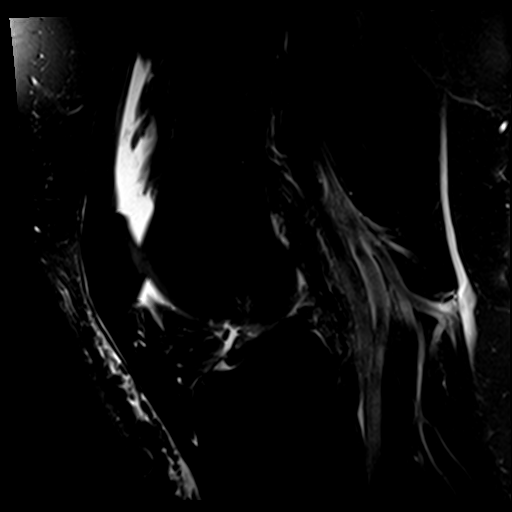
[im 21/27]
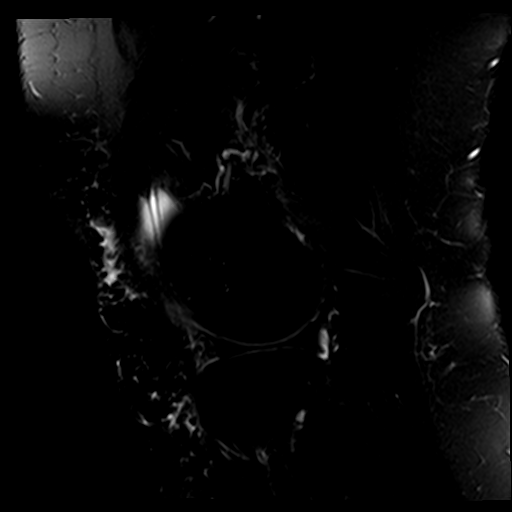
[im 27/27]
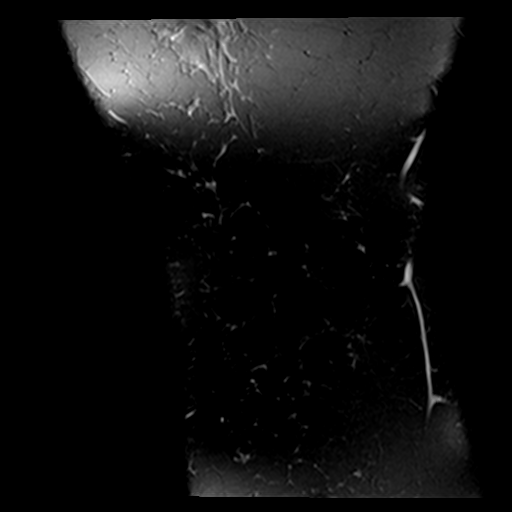

[25 of 40 positions shown; findings below may reference images not displayed]

FINDINGS: MENISCI

Medial meniscus: Suspect radial tear of the posterior root
attachment site of the medial meniscal posterior horn (series 7,
image 14; series 3, image 18). There is intermediate signal within
the medial meniscal body and posterior horn which may reflect
meniscal contusion or intrasubstance degeneration. Slight medial
extrusion of the meniscal body.

Lateral meniscus:  Intact.

LIGAMENTS

Cruciates:  Intact ACL and PCL.

Collaterals: Medial collateral ligament is intact. Lateral
collateral ligament complex is intact.

CARTILAGE

Patellofemoral: Chondral thinning of the medial patellar facet with
an area of near full-thickness fissuring along the mid patellar
apex. No trochlear chondral defect.

Medial: Mild partial-thickness cartilage loss of the weight-bearing
medial femorotibial compartment.

Lateral:  No chondral defect.

Joint: Small-moderate knee joint effusion. Fat pads within normal
limits.

Popliteal Fossa:  No Baker cyst. Intact popliteus tendon.

Extensor Mechanism:  Intact quadriceps tendon and patellar tendon.

Bones: No focal marrow signal abnormality. No fracture or
dislocation.

Other: Small amount of ill-defined fluid within the subcutaneous
tissues anterior to the patellar tendon.
IMPRESSION: 1. Suspect radial tear of the posterior root attachment site of the
medial meniscus.
2. Mild medial and patellofemoral compartment osteoarthritis.
3. Small-to-moderate knee joint effusion.

## 2022-01-21 ENCOUNTER — Other Ambulatory Visit: Payer: Self-pay | Admitting: Obstetrics and Gynecology

## 2022-01-21 DIAGNOSIS — Z1231 Encounter for screening mammogram for malignant neoplasm of breast: Secondary | ICD-10-CM

## 2022-02-14 ENCOUNTER — Ambulatory Visit: Payer: 59

## 2022-03-07 ENCOUNTER — Ambulatory Visit
Admission: RE | Admit: 2022-03-07 | Discharge: 2022-03-07 | Disposition: A | Discharge status: Home / Self Care | Payer: 59 | Source: Ambulatory Visit | Attending: Obstetrics and Gynecology | Admitting: Obstetrics and Gynecology

## 2022-03-07 DIAGNOSIS — Z1231 Encounter for screening mammogram for malignant neoplasm of breast: Secondary | ICD-10-CM | POA: Diagnosis not present

## 2022-03-07 IMAGING — MG MM DIGITAL SCREENING BILAT W/ TOMO AND CAD
8 series · 8 of 24 positions shown · non-contrast
Comparison: Previous exam(s).

CLINICAL DATA: Screening.

EXAM:
DIGITAL SCREENING BILATERAL MAMMOGRAM WITH TOMOSYNTHESIS AND CAD
TECHNIQUE: Bilateral screening digital craniocaudal and mediolateral oblique
mammograms were obtained. Bilateral screening digital breast
tomosynthesis was performed. The images were evaluated with
computer-aided detection.

[R CC synth-2D]
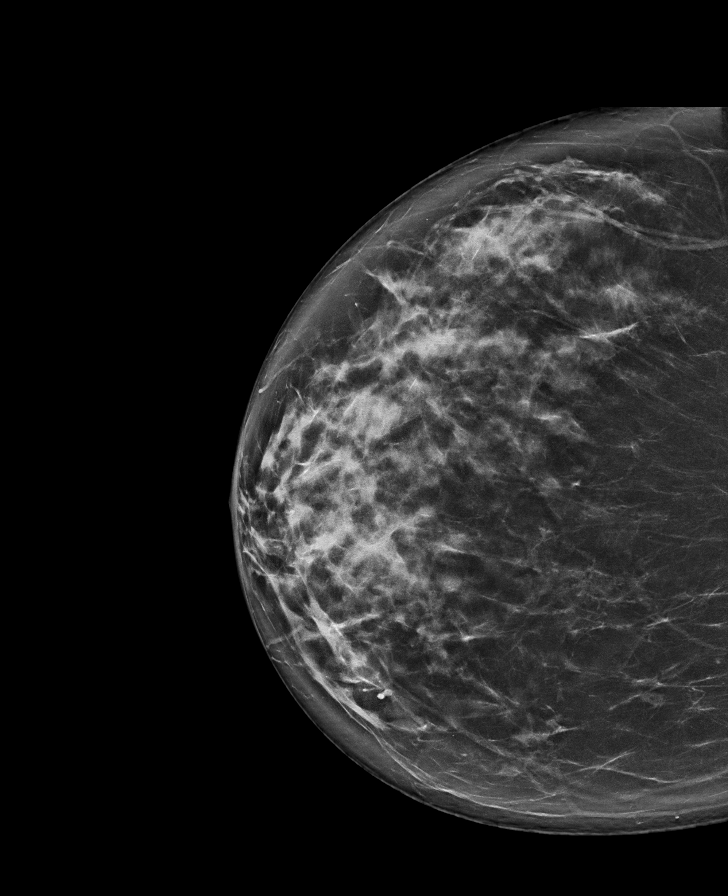

[R MLO synth-2D]
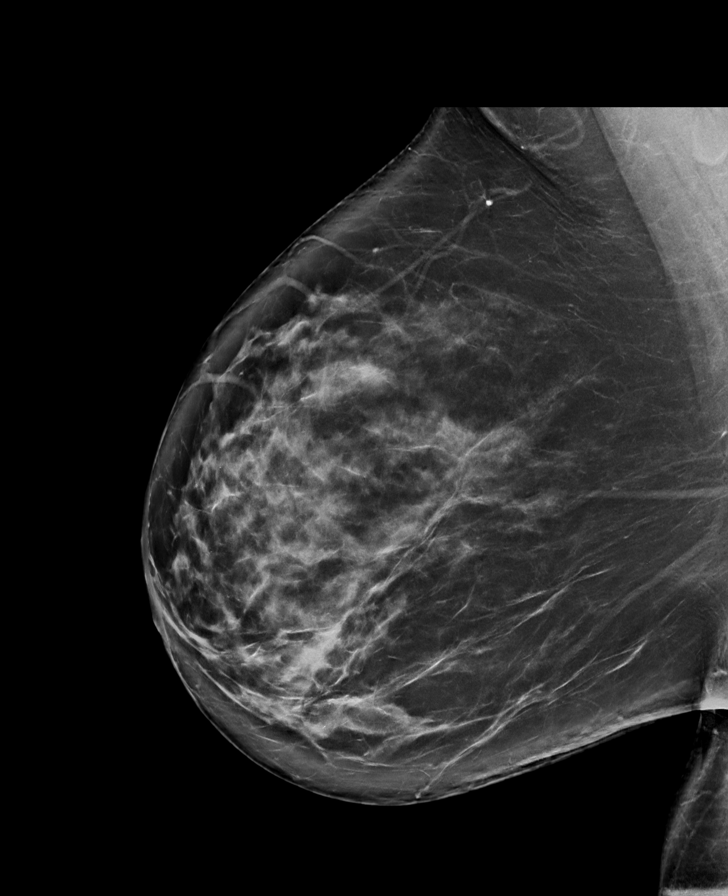

[L MLO synth-2D]
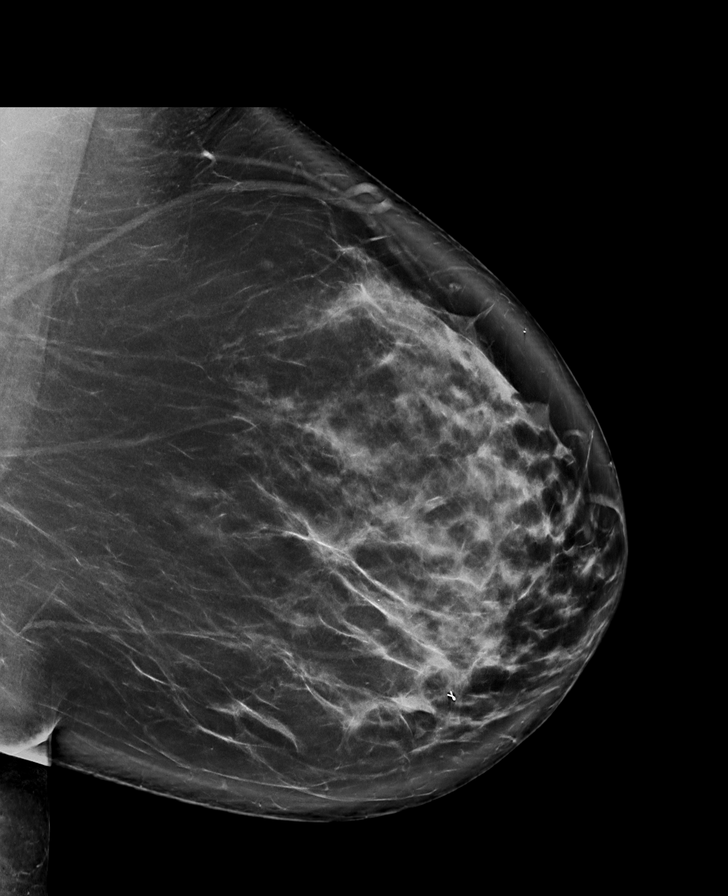

[L CC synth-2D]
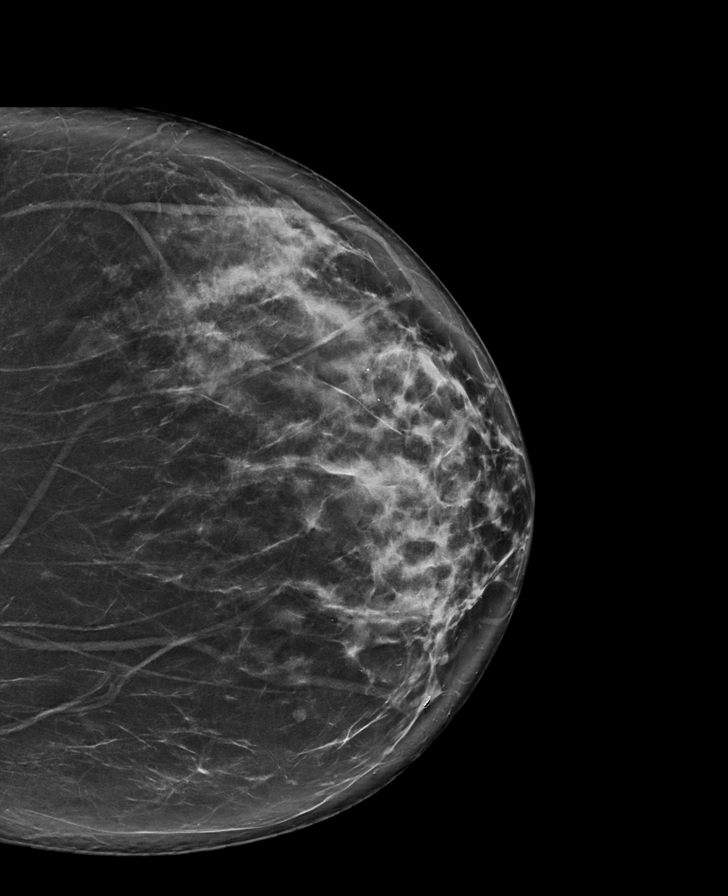

[L CC tomo · tomo slice 47/93.0]
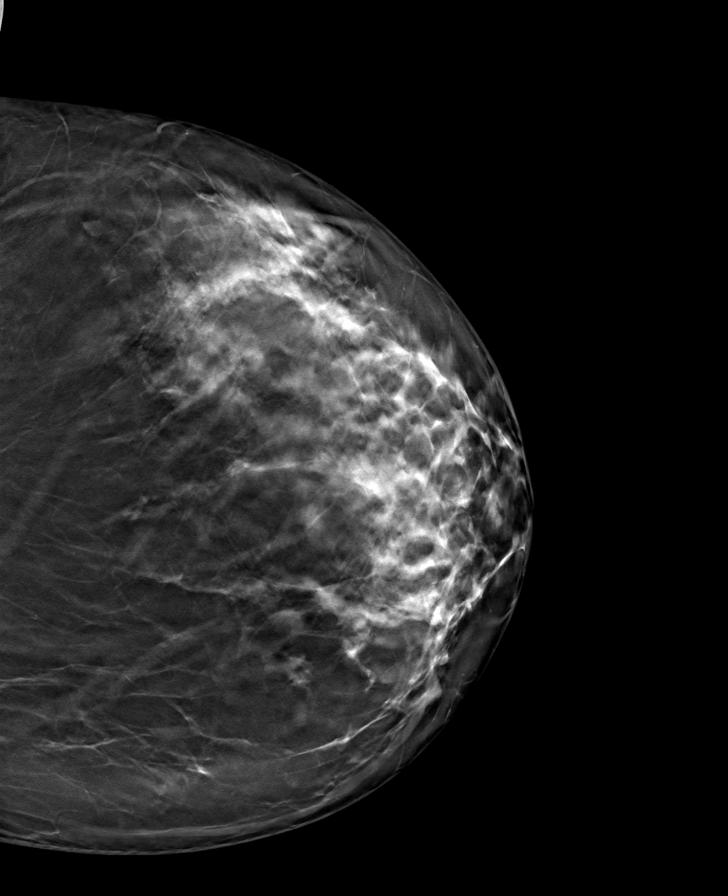

[R MLO tomo · tomo slice 55/108.0]
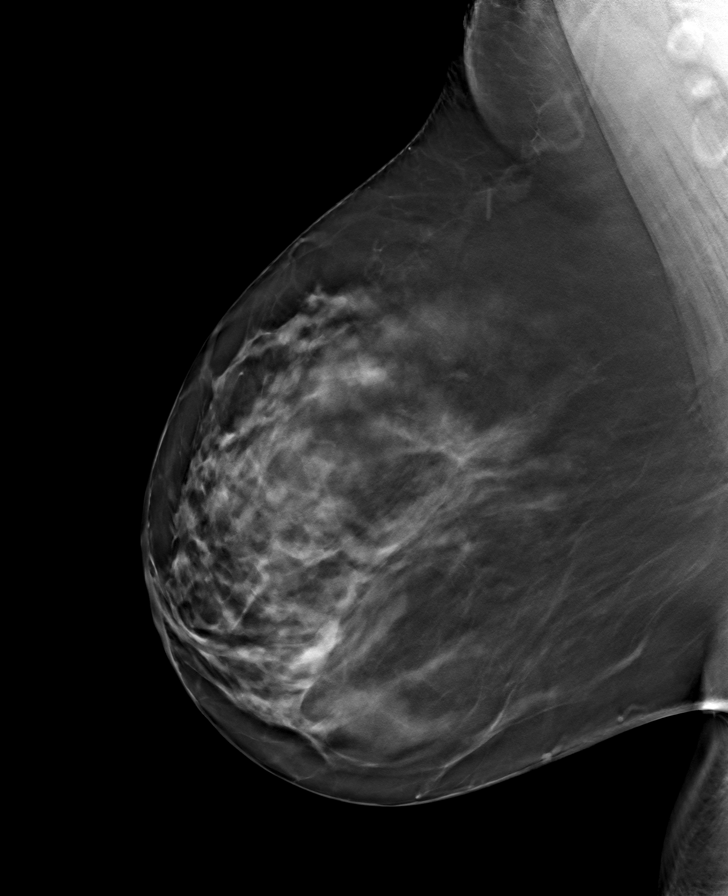

[L MLO tomo · tomo slice 53/105.0]
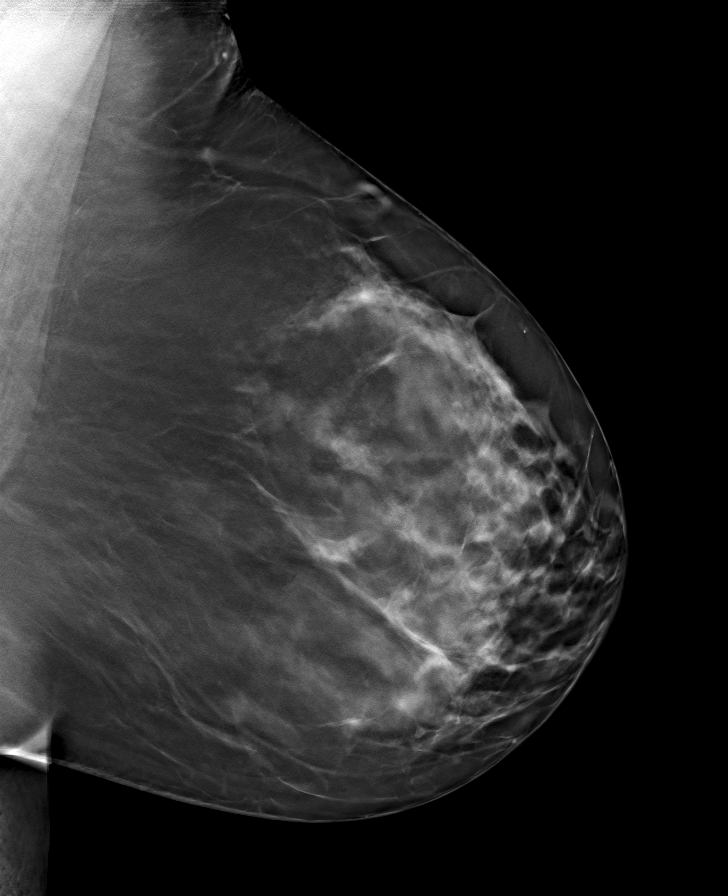

[R CC tomo · tomo slice 47/92.0]
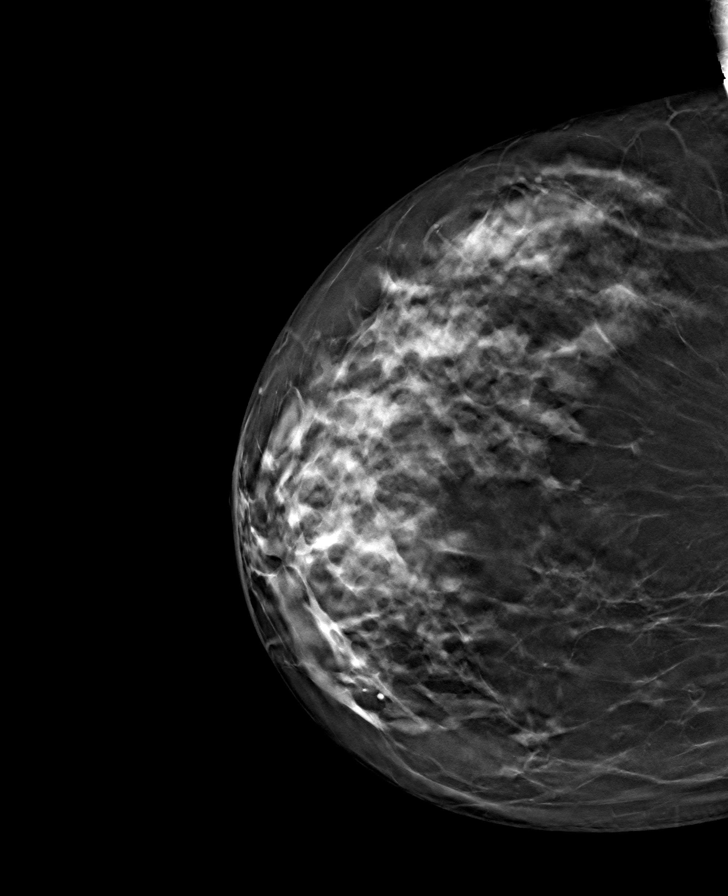

[8 of 24 positions shown; findings below may reference images not displayed]

ACR Breast Density Category b: There are scattered areas of
fibroglandular density.
FINDINGS: There are no findings suspicious for malignancy.
IMPRESSION: No mammographic evidence of malignancy. A result letter of this
screening mammogram will be mailed directly to the patient.

RECOMMENDATION:
Screening mammogram in one year. (Code:[BY])

BI-RADS CATEGORY  1: Negative.

## 2022-03-21 DIAGNOSIS — Z6841 Body Mass Index (BMI) 40.0 and over, adult: Secondary | ICD-10-CM | POA: Diagnosis not present

## 2022-03-21 DIAGNOSIS — N920 Excessive and frequent menstruation with regular cycle: Secondary | ICD-10-CM | POA: Diagnosis not present

## 2022-03-21 DIAGNOSIS — Z01419 Encounter for gynecological examination (general) (routine) without abnormal findings: Secondary | ICD-10-CM | POA: Diagnosis not present

## 2022-03-21 DIAGNOSIS — Z124 Encounter for screening for malignant neoplasm of cervix: Secondary | ICD-10-CM | POA: Diagnosis not present

## 2022-03-21 DIAGNOSIS — R635 Abnormal weight gain: Secondary | ICD-10-CM | POA: Diagnosis not present

## 2022-04-02 DIAGNOSIS — M1712 Unilateral primary osteoarthritis, left knee: Secondary | ICD-10-CM | POA: Diagnosis not present

## 2022-08-15 DIAGNOSIS — M5416 Radiculopathy, lumbar region: Secondary | ICD-10-CM | POA: Diagnosis not present

## 2022-08-26 DIAGNOSIS — J209 Acute bronchitis, unspecified: Secondary | ICD-10-CM | POA: Diagnosis not present

## 2022-09-25 DIAGNOSIS — M5126 Other intervertebral disc displacement, lumbar region: Secondary | ICD-10-CM | POA: Diagnosis not present

## 2022-11-28 DIAGNOSIS — H31001 Unspecified chorioretinal scars, right eye: Secondary | ICD-10-CM | POA: Diagnosis not present

## 2022-12-02 DIAGNOSIS — J069 Acute upper respiratory infection, unspecified: Secondary | ICD-10-CM | POA: Diagnosis not present

## 2023-02-10 ENCOUNTER — Other Ambulatory Visit: Payer: Self-pay | Admitting: Obstetrics and Gynecology

## 2023-02-10 DIAGNOSIS — Z1231 Encounter for screening mammogram for malignant neoplasm of breast: Secondary | ICD-10-CM

## 2023-03-27 ENCOUNTER — Ambulatory Visit
Admission: RE | Admit: 2023-03-27 | Discharge: 2023-03-27 | Disposition: A | Discharge status: Home / Self Care | Payer: 59 | Source: Ambulatory Visit | Attending: Obstetrics and Gynecology | Admitting: Obstetrics and Gynecology

## 2023-03-27 DIAGNOSIS — Z1231 Encounter for screening mammogram for malignant neoplasm of breast: Secondary | ICD-10-CM

## 2023-03-31 ENCOUNTER — Other Ambulatory Visit: Payer: Self-pay | Admitting: Obstetrics and Gynecology

## 2023-03-31 DIAGNOSIS — R928 Other abnormal and inconclusive findings on diagnostic imaging of breast: Secondary | ICD-10-CM

## 2023-04-03 DIAGNOSIS — Z130 Encounter for screening for diseases of the blood and blood-forming organs and certain disorders involving the immune mechanism: Secondary | ICD-10-CM | POA: Diagnosis not present

## 2023-04-03 DIAGNOSIS — Z8349 Family history of other endocrine, nutritional and metabolic diseases: Secondary | ICD-10-CM | POA: Diagnosis not present

## 2023-04-03 DIAGNOSIS — Z13220 Encounter for screening for lipoid disorders: Secondary | ICD-10-CM | POA: Diagnosis not present

## 2023-04-03 DIAGNOSIS — Z131 Encounter for screening for diabetes mellitus: Secondary | ICD-10-CM | POA: Diagnosis not present

## 2023-04-03 DIAGNOSIS — Z01419 Encounter for gynecological examination (general) (routine) without abnormal findings: Secondary | ICD-10-CM | POA: Diagnosis not present

## 2023-04-03 DIAGNOSIS — Z1329 Encounter for screening for other suspected endocrine disorder: Secondary | ICD-10-CM | POA: Diagnosis not present

## 2023-04-03 DIAGNOSIS — Z1321 Encounter for screening for nutritional disorder: Secondary | ICD-10-CM | POA: Diagnosis not present

## 2023-04-03 DIAGNOSIS — Z6841 Body Mass Index (BMI) 40.0 and over, adult: Secondary | ICD-10-CM | POA: Diagnosis not present

## 2023-04-03 DIAGNOSIS — Z13228 Encounter for screening for other metabolic disorders: Secondary | ICD-10-CM | POA: Diagnosis not present

## 2023-04-10 ENCOUNTER — Ambulatory Visit: Payer: 59

## 2023-04-10 ENCOUNTER — Ambulatory Visit
Admission: RE | Admit: 2023-04-10 | Discharge: 2023-04-10 | Disposition: A | Discharge status: Home / Self Care | Payer: 59 | Source: Ambulatory Visit | Attending: Obstetrics and Gynecology | Admitting: Obstetrics and Gynecology

## 2023-04-10 DIAGNOSIS — R928 Other abnormal and inconclusive findings on diagnostic imaging of breast: Secondary | ICD-10-CM

## 2023-04-10 DIAGNOSIS — R922 Inconclusive mammogram: Secondary | ICD-10-CM | POA: Diagnosis not present

## 2023-04-22 DIAGNOSIS — J45909 Unspecified asthma, uncomplicated: Secondary | ICD-10-CM | POA: Diagnosis not present

## 2023-04-22 DIAGNOSIS — F418 Other specified anxiety disorders: Secondary | ICD-10-CM | POA: Diagnosis not present

## 2023-04-22 DIAGNOSIS — E559 Vitamin D deficiency, unspecified: Secondary | ICD-10-CM | POA: Diagnosis not present

## 2024-01-22 DIAGNOSIS — H31001 Unspecified chorioretinal scars, right eye: Secondary | ICD-10-CM | POA: Diagnosis not present

## 2024-02-10 ENCOUNTER — Other Ambulatory Visit: Payer: Self-pay | Admitting: Obstetrics and Gynecology

## 2024-02-10 DIAGNOSIS — Z1231 Encounter for screening mammogram for malignant neoplasm of breast: Secondary | ICD-10-CM

## 2024-04-01 ENCOUNTER — Ambulatory Visit
Admission: RE | Admit: 2024-04-01 | Discharge: 2024-04-01 | Disposition: A | Discharge status: Home / Self Care | Payer: 59 | Source: Ambulatory Visit | Attending: Obstetrics and Gynecology | Admitting: Obstetrics and Gynecology

## 2024-04-01 DIAGNOSIS — Z1231 Encounter for screening mammogram for malignant neoplasm of breast: Secondary | ICD-10-CM

## 2024-04-29 DIAGNOSIS — K219 Gastro-esophageal reflux disease without esophagitis: Secondary | ICD-10-CM | POA: Diagnosis not present

## 2024-04-29 DIAGNOSIS — H1045 Other chronic allergic conjunctivitis: Secondary | ICD-10-CM | POA: Diagnosis not present

## 2024-04-29 DIAGNOSIS — R052 Subacute cough: Secondary | ICD-10-CM | POA: Diagnosis not present

## 2024-04-29 DIAGNOSIS — J310 Chronic rhinitis: Secondary | ICD-10-CM | POA: Diagnosis not present

## 2024-05-06 DIAGNOSIS — Z1329 Encounter for screening for other suspected endocrine disorder: Secondary | ICD-10-CM | POA: Diagnosis not present

## 2024-05-06 DIAGNOSIS — Z13228 Encounter for screening for other metabolic disorders: Secondary | ICD-10-CM | POA: Diagnosis not present

## 2024-05-06 DIAGNOSIS — Z01419 Encounter for gynecological examination (general) (routine) without abnormal findings: Secondary | ICD-10-CM | POA: Diagnosis not present

## 2024-05-06 DIAGNOSIS — Z6841 Body Mass Index (BMI) 40.0 and over, adult: Secondary | ICD-10-CM | POA: Diagnosis not present

## 2024-05-06 DIAGNOSIS — Z13220 Encounter for screening for lipoid disorders: Secondary | ICD-10-CM | POA: Diagnosis not present

## 2024-05-06 DIAGNOSIS — Z130 Encounter for screening for diseases of the blood and blood-forming organs and certain disorders involving the immune mechanism: Secondary | ICD-10-CM | POA: Diagnosis not present

## 2024-05-06 DIAGNOSIS — Z1321 Encounter for screening for nutritional disorder: Secondary | ICD-10-CM | POA: Diagnosis not present

## 2024-05-06 DIAGNOSIS — Z131 Encounter for screening for diabetes mellitus: Secondary | ICD-10-CM | POA: Diagnosis not present

## 2024-07-20 DIAGNOSIS — H9201 Otalgia, right ear: Secondary | ICD-10-CM | POA: Diagnosis not present

## 2024-10-07 DIAGNOSIS — F32A Depression, unspecified: Secondary | ICD-10-CM | POA: Diagnosis not present

## 2024-12-09 DIAGNOSIS — J310 Chronic rhinitis: Secondary | ICD-10-CM | POA: Diagnosis not present

## 2024-12-09 DIAGNOSIS — R739 Hyperglycemia, unspecified: Secondary | ICD-10-CM | POA: Diagnosis not present

## 2024-12-09 DIAGNOSIS — K219 Gastro-esophageal reflux disease without esophagitis: Secondary | ICD-10-CM | POA: Diagnosis not present

## 2024-12-09 DIAGNOSIS — H1045 Other chronic allergic conjunctivitis: Secondary | ICD-10-CM | POA: Diagnosis not present

## 2024-12-09 DIAGNOSIS — R052 Subacute cough: Secondary | ICD-10-CM | POA: Diagnosis not present
